# Patient Record
Sex: Male | Born: 1975 | Race: White | Hispanic: No | Marital: Single | Smoking: Current every day smoker
Health system: Southern US, Community
[De-identification: ages and names within clinical notes are randomized; demographics above are authoritative.]

## PROBLEM LIST (undated history)

## (undated) DIAGNOSIS — I10 Essential (primary) hypertension: Secondary | ICD-10-CM

## (undated) DIAGNOSIS — E785 Hyperlipidemia, unspecified: Secondary | ICD-10-CM

## (undated) DIAGNOSIS — F17200 Nicotine dependence, unspecified, uncomplicated: Secondary | ICD-10-CM

## (undated) HISTORY — DX: Hyperlipidemia, unspecified: E78.5

## (undated) HISTORY — DX: Essential (primary) hypertension: I10

## (undated) HISTORY — DX: Nicotine dependence, unspecified, uncomplicated: F17.200

---

## 1998-09-08 HISTORY — PX: INGUINAL HERNIA REPAIR: SUR1180

## 1998-09-09 ENCOUNTER — Ambulatory Visit (HOSPITAL_BASED_OUTPATIENT_CLINIC_OR_DEPARTMENT_OTHER): Admission: RE | Admit: 1998-09-09 | Discharge: 1998-09-09 | Payer: Self-pay | Admitting: *Deleted

## 1998-09-25 ENCOUNTER — Ambulatory Visit (HOSPITAL_COMMUNITY): Admission: RE | Admit: 1998-09-25 | Discharge: 1998-09-25 | Payer: Self-pay | Admitting: *Deleted

## 1998-11-11 ENCOUNTER — Inpatient Hospital Stay (HOSPITAL_COMMUNITY): Admission: RE | Admit: 1998-11-11 | Discharge: 1998-11-14 | Payer: Self-pay | Admitting: *Deleted

## 2000-03-01 ENCOUNTER — Other Ambulatory Visit (HOSPITAL_COMMUNITY): Admission: RE | Admit: 2000-03-01 | Discharge: 2000-04-18 | Payer: Self-pay | Admitting: Psychiatry

## 2000-03-09 ENCOUNTER — Encounter: Admission: RE | Admit: 2000-03-09 | Discharge: 2000-06-07 | Payer: Self-pay | Admitting: Anesthesiology

## 2000-07-21 ENCOUNTER — Inpatient Hospital Stay (HOSPITAL_COMMUNITY): Admission: EM | Admit: 2000-07-21 | Discharge: 2000-07-25 | Payer: Self-pay | Admitting: Psychiatry

## 2000-07-26 ENCOUNTER — Other Ambulatory Visit (HOSPITAL_COMMUNITY): Admission: RE | Admit: 2000-07-26 | Discharge: 2000-08-05 | Payer: Self-pay | Admitting: Psychiatry

## 2000-08-24 ENCOUNTER — Inpatient Hospital Stay (HOSPITAL_COMMUNITY): Admission: EM | Admit: 2000-08-24 | Discharge: 2000-09-01 | Payer: Self-pay | Admitting: Psychiatry

## 2000-09-02 ENCOUNTER — Other Ambulatory Visit (HOSPITAL_COMMUNITY): Admission: RE | Admit: 2000-09-02 | Discharge: 2000-09-05 | Payer: Self-pay | Admitting: Psychiatry

## 2000-09-05 ENCOUNTER — Ambulatory Visit (HOSPITAL_COMMUNITY): Admission: RE | Admit: 2000-09-05 | Discharge: 2000-09-05 | Payer: Self-pay | Admitting: Psychiatry

## 2000-09-20 ENCOUNTER — Inpatient Hospital Stay (HOSPITAL_COMMUNITY): Admission: EM | Admit: 2000-09-20 | Discharge: 2000-09-23 | Payer: Self-pay | Admitting: *Deleted

## 2000-10-06 ENCOUNTER — Inpatient Hospital Stay (HOSPITAL_COMMUNITY): Admission: EM | Admit: 2000-10-06 | Discharge: 2000-10-07 | Payer: Self-pay | Admitting: Psychiatry

## 2001-06-20 ENCOUNTER — Inpatient Hospital Stay (HOSPITAL_COMMUNITY): Admission: EM | Admit: 2001-06-20 | Discharge: 2001-06-21 | Payer: Self-pay | Admitting: Psychiatry

## 2001-06-20 ENCOUNTER — Encounter: Payer: Self-pay | Admitting: Emergency Medicine

## 2001-06-22 ENCOUNTER — Other Ambulatory Visit (HOSPITAL_COMMUNITY): Admission: RE | Admit: 2001-06-22 | Discharge: 2001-06-27 | Payer: Self-pay | Admitting: Psychiatry

## 2002-02-07 ENCOUNTER — Inpatient Hospital Stay (HOSPITAL_COMMUNITY): Admission: EM | Admit: 2002-02-07 | Discharge: 2002-02-09 | Payer: Self-pay | Admitting: Psychiatry

## 2002-04-16 ENCOUNTER — Inpatient Hospital Stay (HOSPITAL_COMMUNITY): Admission: EM | Admit: 2002-04-16 | Discharge: 2002-04-17 | Payer: Self-pay | Admitting: *Deleted

## 2002-04-16 ENCOUNTER — Encounter: Payer: Self-pay | Admitting: Internal Medicine

## 2002-04-17 ENCOUNTER — Inpatient Hospital Stay (HOSPITAL_COMMUNITY): Admission: EM | Admit: 2002-04-17 | Discharge: 2002-04-20 | Payer: Self-pay | Admitting: Psychiatry

## 2002-04-25 ENCOUNTER — Inpatient Hospital Stay (HOSPITAL_COMMUNITY): Admission: EM | Admit: 2002-04-25 | Discharge: 2002-04-27 | Payer: Self-pay | Admitting: Psychiatry

## 2018-03-07 ENCOUNTER — Ambulatory Visit: Payer: BLUE CROSS/BLUE SHIELD | Admitting: Family Medicine

## 2018-03-07 ENCOUNTER — Encounter: Payer: Self-pay | Admitting: Family Medicine

## 2018-03-07 VITALS — BP 110/82 | HR 80 | Temp 98.0°F | Wt 220.0 lb

## 2018-03-07 DIAGNOSIS — Z23 Encounter for immunization: Secondary | ICD-10-CM

## 2018-03-07 DIAGNOSIS — Z720 Tobacco use: Secondary | ICD-10-CM | POA: Diagnosis not present

## 2018-03-07 DIAGNOSIS — Z8042 Family history of malignant neoplasm of prostate: Secondary | ICD-10-CM

## 2018-03-07 DIAGNOSIS — Z0000 Encounter for general adult medical examination without abnormal findings: Secondary | ICD-10-CM | POA: Diagnosis not present

## 2018-03-07 LAB — POCT URINALYSIS DIPSTICK
BILIRUBIN UA: NEGATIVE
Glucose, UA: NEGATIVE
Ketones, UA: NEGATIVE
Leukocytes, UA: NEGATIVE
Nitrite, UA: NEGATIVE
PH UA: 6 (ref 5.0–8.0)
RBC UA: NEGATIVE
Spec Grav, UA: >=1.03 — AB (ref 1.010–1.025)
UROBILINOGEN UA: 0.2 U/dL

## 2018-03-07 MED ORDER — VARENICLINE TARTRATE 0.5 MG PO TABS: ORAL_TABLET | ORAL | 11 refills | Status: DC

## 2018-03-07 NOTE — Progress Notes (Signed)
Eduard Clos is a 42 year old single male smoker.Marland Kitchen... 1/2-3/4 a pack of cigarettes a day..... Who comes in today for general physical examination  His father was Dr. Ivin Booty one of our heart surgeons who died last year pancreatic cancer  Tries been hospitalized right inguinal hernia surgery otherwise no other hospitalizations. He has had a history of alcohol abuse. He says he now drinks 18 beers per week. He doesn't drink daily.  No major illnesses or injuries  No major allergies. Tetanus booster given today.  Family history his father died last year pancreatic cancer mother has history of hypertension thyroid nodules. Maternal grandfather prostate cancer. One sister in good health another sister has alopecia autoimmune disease.  Recommend annual eye exam since his grandmother had glaucoma. He does get regular dental care. Colonoscopy not until age 38. No family history of colon cancer polyps.  Social history......Marland Kitchen single lives in [place] with his sister. 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 He's now spending a lot of time in [place] with his mom helping her deal with his father's affairs.  He does express a willingness to stop smoking. He's never tried the Chantix program.  14 point review of systems June otherwise negative  BP 110/82 (BP Location: Left Arm, Patient Position: Sitting, Cuff Size: Large)   Pulse 80   Temp 98 F (36.7 C) (Oral)   Wt 220 lb (99.8 kg)  Well-developed well-nourished male no acute distress vital signs stable he is afebrile HEENT were negative neck was supple thyroid is not enlarged cardiopulmonary exam normal abdominal exam normal extremities normal skin normal peripheral pulses normal  #1 healthy male  #2 tobacco abuse.......... start on the Chantix program  #3 grief reaction secondary to father's death.......Marland Kitchen recommend he and his mom go to the hospice grief counseling program.  #4 family history of prostate cancer..........Marland Kitchen begin screening  PSAs now.

## 2018-03-07 NOTE — Patient Instructions (Signed)
Labs today....... I will call you if there is anything abnormal  Chantix 0.5...........Marland Kitchen 1 daily in the morning.........Marland Kitchen begin to taper you cigarettes by taking your total daily consumption and decreasing by 2 per week  Return in May 28 for follow-up  I would suggest that you and your mom both go to the hospice grief counseling program

## 2018-03-08 LAB — CBC WITH DIFFERENTIAL/PLATELET
BASOS PCT: 1.2 % (ref 0.0–3.0)
Basophils Absolute: 0.1 10*3/uL (ref 0.0–0.1)
EOS ABS: 0.1 10*3/uL (ref 0.0–0.7)
Eosinophils Relative: 1.3 % (ref 0.0–5.0)
HCT: 51.5 % (ref 39.0–52.0)
HEMOGLOBIN: 17.6 g/dL — AB (ref 13.0–17.0)
Lymphocytes Relative: 34.6 % (ref 12.0–46.0)
Lymphs Abs: 2.6 10*3/uL (ref 0.7–4.0)
MCHC: 34.2 g/dL (ref 30.0–36.0)
MCV: 97.3 fl (ref 78.0–100.0)
MONO ABS: 0.7 10*3/uL (ref 0.1–1.0)
Monocytes Relative: 9 % (ref 3.0–12.0)
NEUTROS PCT: 53.9 % (ref 43.0–77.0)
Neutro Abs: 4 10*3/uL (ref 1.4–7.7)
Platelets: 236 10*3/uL (ref 150.0–400.0)
RBC: 5.3 Mil/uL (ref 4.22–5.81)
RDW: 13.1 % (ref 11.5–15.5)
WBC: 7.5 10*3/uL (ref 4.0–10.5)

## 2018-03-08 LAB — BASIC METABOLIC PANEL
BUN: 10 mg/dL (ref 6–23)
CALCIUM: 9.9 mg/dL (ref 8.4–10.5)
CO2: 26 mEq/L (ref 19–32)
Chloride: 104 mEq/L (ref 96–112)
Creatinine, Ser: 0.82 mg/dL (ref 0.40–1.50)
GFR: 110.31 mL/min (ref >60.00)
Glucose, Bld: 87 mg/dL (ref 70–99)
Potassium: 4.2 mEq/L (ref 3.5–5.1)
SODIUM: 140 meq/L (ref 135–145)

## 2018-03-08 LAB — LDL CHOLESTEROL, DIRECT: LDL DIRECT: 191 mg/dL

## 2018-03-08 LAB — HEPATIC FUNCTION PANEL
ALT: 80 U/L — ABNORMAL HIGH (ref 0–53)
AST: 43 U/L — ABNORMAL HIGH (ref 0–37)
Albumin: 4.7 g/dL (ref 3.5–5.2)
Alkaline Phosphatase: 72 U/L (ref 39–117)
Bilirubin, Direct: 0.1 mg/dL (ref 0.0–0.3)
Total Bilirubin: 0.8 mg/dL (ref 0.2–1.2)
Total Protein: 7.4 g/dL (ref 6.0–8.3)

## 2018-03-08 LAB — LIPID PANEL
Cholesterol: 258 mg/dL — ABNORMAL HIGH (ref 0–200)
HDL: 42 mg/dL (ref >39.00)
NonHDL: 216.32
Total CHOL/HDL Ratio: 6
Triglycerides: 204 mg/dL — ABNORMAL HIGH (ref 0.0–149.0)
VLDL: 40.8 mg/dL — ABNORMAL HIGH (ref 0.0–40.0)

## 2018-03-08 LAB — PSA: PSA: 0.23 ng/mL (ref 0.10–4.00)

## 2018-03-08 LAB — TSH: TSH: 1.09 u[IU]/mL (ref 0.35–4.50)

## 2018-04-04 ENCOUNTER — Ambulatory Visit: Payer: BLUE CROSS/BLUE SHIELD | Admitting: Family Medicine

## 2021-05-18 HISTORY — PX: BACK SURGERY: SHX140

## 2022-01-01 ENCOUNTER — Other Ambulatory Visit: Payer: Self-pay

## 2022-01-01 ENCOUNTER — Ambulatory Visit (INDEPENDENT_AMBULATORY_CARE_PROVIDER_SITE_OTHER): Payer: No Typology Code available for payment source | Admitting: Internal Medicine

## 2022-01-01 ENCOUNTER — Encounter: Payer: Self-pay | Admitting: Internal Medicine

## 2022-01-01 VITALS — BP 132/94 | HR 93 | Temp 98.3°F | Resp 16 | Ht 72.0 in | Wt 250.0 lb

## 2022-01-01 DIAGNOSIS — R7989 Other specified abnormal findings of blood chemistry: Secondary | ICD-10-CM | POA: Insufficient documentation

## 2022-01-01 DIAGNOSIS — E7801 Familial hypercholesterolemia: Secondary | ICD-10-CM | POA: Diagnosis not present

## 2022-01-01 DIAGNOSIS — F331 Major depressive disorder, recurrent, moderate: Secondary | ICD-10-CM | POA: Insufficient documentation

## 2022-01-01 DIAGNOSIS — Z23 Encounter for immunization: Secondary | ICD-10-CM

## 2022-01-01 DIAGNOSIS — E6609 Other obesity due to excess calories: Secondary | ICD-10-CM | POA: Diagnosis not present

## 2022-01-01 DIAGNOSIS — Z6833 Body mass index (BMI) 33.0-33.9, adult: Secondary | ICD-10-CM

## 2022-01-01 DIAGNOSIS — M545 Low back pain, unspecified: Secondary | ICD-10-CM

## 2022-01-01 DIAGNOSIS — G8929 Other chronic pain: Secondary | ICD-10-CM | POA: Diagnosis not present

## 2022-01-01 DIAGNOSIS — Z0001 Encounter for general adult medical examination with abnormal findings: Secondary | ICD-10-CM | POA: Diagnosis not present

## 2022-01-01 DIAGNOSIS — I10 Essential (primary) hypertension: Secondary | ICD-10-CM | POA: Diagnosis not present

## 2022-01-01 DIAGNOSIS — E66811 Obesity, class 1: Secondary | ICD-10-CM | POA: Insufficient documentation

## 2022-01-01 DIAGNOSIS — R109 Unspecified abdominal pain: Secondary | ICD-10-CM | POA: Diagnosis not present

## 2022-01-01 DIAGNOSIS — R0683 Snoring: Secondary | ICD-10-CM

## 2022-01-01 DIAGNOSIS — Z8042 Family history of malignant neoplasm of prostate: Secondary | ICD-10-CM

## 2022-01-01 LAB — PSA: PSA: 0.16 ng/mL (ref 0.10–4.00)

## 2022-01-01 LAB — HEPATIC FUNCTION PANEL
ALT: 90 U/L — ABNORMAL HIGH (ref 0–53)
AST: 34 U/L (ref 0–37)
Albumin: 5 g/dL (ref 3.5–5.2)
Alkaline Phosphatase: 70 U/L (ref 39–117)
Bilirubin, Direct: 0.1 mg/dL (ref 0.0–0.3)
Total Bilirubin: 0.5 mg/dL (ref 0.2–1.2)
Total Protein: 7.5 g/dL (ref 6.0–8.3)

## 2022-01-01 LAB — URINALYSIS, ROUTINE W REFLEX MICROSCOPIC
Bilirubin Urine: NEGATIVE
Hgb urine dipstick: NEGATIVE
Ketones, ur: NEGATIVE
Leukocytes,Ua: NEGATIVE
Nitrite: NEGATIVE
Specific Gravity, Urine: >=1.03 — AB (ref 1.000–1.030)
Urine Glucose: NEGATIVE
Urobilinogen, UA: 0.2 (ref 0.0–1.0)
pH: 5.5 (ref 5.0–8.0)

## 2022-01-01 LAB — BASIC METABOLIC PANEL
BUN: 10 mg/dL (ref 6–23)
CO2: 31 mEq/L (ref 19–32)
Calcium: 10 mg/dL (ref 8.4–10.5)
Chloride: 102 mEq/L (ref 96–112)
Creatinine, Ser: 0.9 mg/dL (ref 0.40–1.50)
GFR: 104 mL/min (ref >60.00)
Glucose, Bld: 99 mg/dL (ref 70–99)
Potassium: 4 mEq/L (ref 3.5–5.1)
Sodium: 139 mEq/L (ref 135–145)

## 2022-01-01 LAB — LIPID PANEL
Cholesterol: 283 mg/dL — ABNORMAL HIGH (ref 0–200)
HDL: 41.3 mg/dL (ref >39.00)
NonHDL: 241.29
Total CHOL/HDL Ratio: 7
Triglycerides: 254 mg/dL — ABNORMAL HIGH (ref 0.0–149.0)
VLDL: 50.8 mg/dL — ABNORMAL HIGH (ref 0.0–40.0)

## 2022-01-01 LAB — CBC WITH DIFFERENTIAL/PLATELET
Basophils Absolute: 0.1 10*3/uL (ref 0.0–0.1)
Basophils Relative: 0.6 % (ref 0.0–3.0)
Eosinophils Absolute: 0.1 10*3/uL (ref 0.0–0.7)
Eosinophils Relative: 1 % (ref 0.0–5.0)
HCT: 53.8 % — ABNORMAL HIGH (ref 39.0–52.0)
Hemoglobin: 17.9 g/dL — ABNORMAL HIGH (ref 13.0–17.0)
Lymphocytes Relative: 32.7 % (ref 12.0–46.0)
Lymphs Abs: 3 10*3/uL (ref 0.7–4.0)
MCHC: 33.2 g/dL (ref 30.0–36.0)
MCV: 95.5 fl (ref 78.0–100.0)
Monocytes Absolute: 0.7 10*3/uL (ref 0.1–1.0)
Monocytes Relative: 7.3 % (ref 3.0–12.0)
Neutro Abs: 5.4 10*3/uL (ref 1.4–7.7)
Neutrophils Relative %: 58.4 % (ref 43.0–77.0)
Platelets: 228 10*3/uL (ref 150.0–400.0)
RBC: 5.64 Mil/uL (ref 4.22–5.81)
RDW: 14.8 % (ref 11.5–15.5)
WBC: 9.1 10*3/uL (ref 4.0–10.5)

## 2022-01-01 LAB — TSH: TSH: 2.32 u[IU]/mL (ref 0.35–5.50)

## 2022-01-01 LAB — LDL CHOLESTEROL, DIRECT: Direct LDL: 206 mg/dL

## 2022-01-01 MED ORDER — QUETIAPINE FUMARATE 25 MG PO TABS: 25.0000 mg | ORAL_TABLET | Freq: Every day | ORAL | 0 refills | Status: DC

## 2022-01-01 MED ORDER — ROSUVASTATIN CALCIUM 20 MG PO TABS
20.0000 mg | ORAL_TABLET | Freq: Every day | ORAL | 1 refills | Status: DC
Start: 2022-01-01 — End: 2022-08-21

## 2022-01-01 MED ORDER — DULOXETINE HCL 30 MG PO CPEP: 30.0000 mg | ORAL_CAPSULE | Freq: Every day | ORAL | 0 refills | Status: DC

## 2022-01-01 NOTE — Patient Instructions (Signed)

## 2022-01-01 NOTE — Progress Notes (Signed)
Subjective:  Patient ID: Anthony Nielsen, male    DOB: August 03, 1976  Age: 46 y.o. MRN: [id number]  CC: Annual Exam and Hypertension  This visit occurred during the SARS-CoV-2 public health emergency.  Safety protocols were in place, including screening questions prior to the visit, additional usage of staff PPE, and extensive cleaning of exam room while observing appropriate contact time as indicated for disinfecting solutions.    HPI Anthony Nielsen presents for a CPX and to establish.  He completed a long-term rehab about a year and a half ago.  He tells me he has been diagnosed with PTSD, ADHD, alcohol use disorder, and possibly bipolar.  He has abstained from alcohol for at least 2 weeks and is attending [place] meetings and has a sponsor. He tried Zyprexa but gained a lot of weight.  He stopped taking Lexapro about a month ago because it was not helping him.  He has racing thoughts that cause insomnia.  He complains of fatigue.  He has had some feelings of hopelessness and helplessness but denies SI or HI.  He complains of chronic fatigue and weight gain.  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  He also mentions a 57-month history of intermittent left flank pain that he describes as a sharp sensation that comes and goes.  He has had dyspnea on exertion for 10 years.  He denies chest pain, diaphoresis, dizziness, lightheadedness, or palpitations.  Outpatient Medications Prior to Visit  Medication Sig Dispense Refill   finasteride (PROPECIA) 1 MG tablet Take 1 mg by mouth daily.     minoxidil (LONITEN) 10 MG tablet Take 10 mg by mouth daily.     varenicline (CHANTIX) 0.5 MG tablet 1 tab every morning 30 tablet 11   No facility-administered medications prior to visit.    ROS Review of Systems  Constitutional:  Positive for fatigue and unexpected weight change. Negative for appetite change, chills and diaphoresis.  HENT: Negative.    Eyes: Negative.   Respiratory:   Positive for shortness of breath. Negative for cough, chest tightness and wheezing.        ++snoring  Cardiovascular:  Negative for chest pain, palpitations and leg swelling.  Gastrointestinal:  Positive for abdominal pain. Negative for blood in stool, constipation, diarrhea and vomiting.  Endocrine: Negative.   Genitourinary: Negative.  Negative for difficulty urinating, dysuria, hematuria, penile swelling, scrotal swelling and testicular pain.  Musculoskeletal:  Positive for arthralgias and back pain. Negative for myalgias.  Skin: Negative.   Neurological:  Negative for dizziness, weakness, light-headedness and headaches.  Hematological:  Negative for adenopathy. Does not bruise/bleed easily.  Psychiatric/Behavioral: Negative.     Objective:  BP (!) 132/94 (BP Location: Left Arm, Patient Position: Sitting, Cuff Size: Large) Comment: thigh cuff (R) 132/94 (L) 132/98   Pulse 93    Temp 98.3 F (36.8 C) (Oral)    Resp 16    Ht 6' (1.829 m)    Wt 250 lb (113.4 kg)    SpO2 95%    BMI 33.91 kg/m   BP Readings from Last 3 Encounters:  01/01/22 (!) 132/94  03/07/18 110/82    Wt Readings from Last 3 Encounters:  01/01/22 250 lb (113.4 kg)  03/07/18 220 lb (99.8 kg)    Physical Exam Vitals reviewed.  Constitutional:      Appearance: He is not ill-appearing.  HENT:     Nose: Nose normal.     Mouth/Throat:  Mouth: Mucous membranes are moist.  Eyes:     General: No scleral icterus.    Conjunctiva/sclera: Conjunctivae normal.  Cardiovascular:     Rate and Rhythm: Normal rate and regular rhythm.     Heart sounds: Normal heart sounds, S1 normal and S2 normal. No murmur heard.   No friction rub. No gallop.     Comments: EKG- NSR, 82 bpm No LVH Normal EKG Pulmonary:     Effort: Pulmonary effort is normal.     Breath sounds: No stridor. No wheezing, rhonchi or rales.  Abdominal:     General: Abdomen is protuberant. There is no abdominal bruit.     Palpations: There is no mass.      Tenderness: There is abdominal tenderness (left flank is tender) in the left upper quadrant. There is no right CVA tenderness, left CVA tenderness or guarding.  Musculoskeletal:     Right lower leg: No edema.     Left lower leg: No edema.  Skin:    General: Skin is warm and dry.  Neurological:     General: No focal deficit present.     Mental Status: He is alert.  Psychiatric:        Attention and Perception: Attention and perception normal.        Mood and Affect: Mood is anxious and depressed. Affect is flat.        Speech: Speech normal. Speech is not rapid and pressured, delayed, slurred or tangential.        Behavior: Behavior normal. Behavior is not agitated, slowed, aggressive or withdrawn. Behavior is cooperative.        Thought Content: Thought content normal. Thought content is not paranoid or delusional. Thought content does not include homicidal or suicidal ideation.        Cognition and Memory: Cognition normal.        Judgment: Judgment is not impulsive.    Lab Results  Component Value Date   WBC 9.1 01/01/2022   HGB 17.9 Repeated and verified X2. (H) 01/01/2022   HCT 53.8 (H) 01/01/2022   PLT 228.0 01/01/2022   GLUCOSE 99 01/01/2022   CHOL 283 (H) 01/01/2022   TRIG 254.0 (H) 01/01/2022   HDL 41.30 01/01/2022   LDLDIRECT 206.0 01/01/2022   ALT 90 (H) 01/01/2022   AST 34 01/01/2022   NA 139 01/01/2022   K 4.0 01/01/2022   CL 102 01/01/2022   CREATININE 0.90 01/01/2022   BUN 10 01/01/2022   CO2 31 01/01/2022   TSH 2.32 01/01/2022   PSA 0.16 01/01/2022    No results found.  Assessment & Plan:   Anthony Nielsen was seen today for annual exam and hypertension.  Diagnoses and all orders for this visit:  Hypertension, unspecified type- He has stage I hypertension.  He prefers not to take an antihypertensive.  He will improve his lifestyle modifications. -     EKG 12-Lead -     Basic metabolic panel; Future -     Aldosterone + renin activity w/ ratio; Future -      CBC with Differential/Platelet; Future -     TSH; Future -     Urinalysis, Routine w reflex microscopic; Future -     Urinalysis, Routine w reflex microscopic -     TSH -     CBC with Differential/Platelet -     Aldosterone + renin activity w/ ratio -     Basic metabolic panel  Chronic left flank pain- Labs are remarkable  for elevated liver enzymes.  I have asked him to undergo a CT with contrast to screen for pathology. -     Urinalysis, Routine w reflex microscopic; Future -     Urinalysis, Routine w reflex microscopic -     CT Abdomen Pelvis W Contrast; Future  Encounter for general adult medical examination with abnormal findings- Exam completed, labs reviewed, vaccines reviewed and updated, cancer screenings addressed, patient education was given. -     Lipid panel; Future -     PSA; Future -     HIV Antibody (routine testing w rflx); Future -     HIV Antibody (routine testing w rflx) -     PSA -     Lipid panel  Family history of prostate cancer -     PSA; Future -     PSA  Class 1 obesity due to excess calories with serious comorbidity and body mass index (BMI) of 33.0 to 33.9 in adult  Moderate episode of recurrent major depressive disorder (HCC) -     DULoxetine (CYMBALTA) 30 MG capsule; Take 1 capsule (30 mg total) by mouth daily. -     QUEtiapine (SEROQUEL) 25 MG tablet; Take 1 tablet (25 mg total) by mouth at bedtime. -     TSH; Future -     TSH  Chronic bilateral low back pain without sciatica -     DULoxetine (CYMBALTA) 30 MG capsule; Take 1 capsule (30 mg total) by mouth daily.  Elevated LFTs- Screening for viral hepatitis is negative.  I have ordered a CT scan with contrast to screen for malignancy.  I have asked him to get vaccinated against hepatitis A and B. -     Hepatic function panel; Future -     Hepatitis B surface antibody,quantitative; Future -     Hepatitis B surface antigen; Future -     Hepatitis A antibody, total; Future -     Hepatitis B core  antibody, total; Future -     Hepatitis C antibody; Future -     Hepatitis C antibody -     Hepatitis B core antibody, total -     Hepatitis A antibody, total -     Hepatitis B surface antigen -     Hepatitis B surface antibody,quantitative -     Hepatic function panel -     CT Abdomen Pelvis W Contrast; Future  Essential familial hypercholesterolemia -     rosuvastatin (CRESTOR) 20 MG tablet; Take 1 tablet (20 mg total) by mouth daily.  Snoring -     Ambulatory referral to Sleep Studies  Other orders -     Flu Vaccine QUAD 6+ mos PF IM (Fluarix Quad PF) -     LDL cholesterol, direct   I have discontinued Denton Meek. [place]'s finasteride, minoxidil, and varenicline. I am also having him start on DULoxetine, QUEtiapine, and rosuvastatin.  Meds ordered this encounter  Medications   DULoxetine (CYMBALTA) 30 MG capsule    Sig: Take 1 capsule (30 mg total) by mouth daily.    Dispense:  30 capsule    Refill:  0   QUEtiapine (SEROQUEL) 25 MG tablet    Sig: Take 1 tablet (25 mg total) by mouth at bedtime.    Dispense:  30 tablet    Refill:  0   rosuvastatin (CRESTOR) 20 MG tablet    Sig: Take 1 tablet (20 mg total) by mouth daily.    Dispense:  90 tablet  Refill:  1     Follow-up: Return in about 3 months (around 03/31/2022).  [name], MD

## 2022-01-06 ENCOUNTER — Encounter: Payer: Self-pay | Admitting: Internal Medicine

## 2022-01-06 LAB — ALDOSTERONE + RENIN ACTIVITY W/ RATIO
ALDO / PRA Ratio: 7 Ratio (ref 0.9–28.9)
Aldosterone: 16 ng/dL
Renin Activity: 2.28 ng/mL/h (ref 0.25–5.82)

## 2022-01-06 LAB — HEPATITIS C ANTIBODY
Hepatitis C Ab: NONREACTIVE
SIGNAL TO CUT-OFF: <0.02 (ref <1.00)

## 2022-01-06 LAB — HEPATITIS A ANTIBODY, TOTAL: Hepatitis A AB,Total: NONREACTIVE

## 2022-01-06 LAB — HEPATITIS B SURFACE ANTIBODY, QUANTITATIVE: Hep B S AB Quant (Post): <5 m[IU]/mL — ABNORMAL LOW (ref >=10)

## 2022-01-06 LAB — HIV ANTIBODY (ROUTINE TESTING W REFLEX): HIV 1&2 Ab, 4th Generation: NONREACTIVE

## 2022-01-06 LAB — HEPATITIS B CORE ANTIBODY, TOTAL: Hep B Core Total Ab: NONREACTIVE

## 2022-01-06 LAB — HEPATITIS B SURFACE ANTIGEN: Hepatitis B Surface Ag: NONREACTIVE

## 2022-01-08 ENCOUNTER — Other Ambulatory Visit: Payer: Self-pay | Admitting: Neurological Surgery

## 2022-01-12 ENCOUNTER — Encounter (HOSPITAL_COMMUNITY): Payer: Self-pay | Admitting: Neurological Surgery

## 2022-01-12 ENCOUNTER — Other Ambulatory Visit: Payer: Self-pay

## 2022-01-12 NOTE — Progress Notes (Signed)
PCP - [name], MD ?Cardiologist - denies ?EKG - 01/01/22 ?Chest x-ray -  ?ECHO -  ?Cardiac Cath -  ?CPAP -  ? ?ERAS Protcol - clears 0900 ?COVID TEST- DOS ? ?Anesthesia review: n/a ? ?------------- ? ?SDW INSTRUCTIONS: ? ?Your procedure is scheduled on wed 3/8. Please report to [facility] Main Entrance "A" at 0930 A.M., and check in at the Admitting office. Call this number if you have problems the morning of surgery: [phone] ? ? ?Remember: Do not eat after midnight the night before your surgery ? ?You may drink clear liquids until 0900 AM the morning of your surgery.   ?Clear liquids allowed are: Water, Non-Citrus Juices (without pulp), Carbonated Beverages, Clear Tea, Black Coffee Only, and Gatorade ?  ?Medications to take morning of surgery with a sip of water include: ?DULoxetine (CYMBALTA) ? ?As of today, STOP taking any Aspirin (unless otherwise instructed by your surgeon), Aleve, Naproxen, Ibuprofen, Motrin, Advil, Goody's, BC's, all herbal medications, fish oil, and all vitamins. ? ?  ?The Morning of Surgery ?Do not wear jewelry ?Do not wear lotions, powders, colognes, or deodorant ?Do not bring valuables to the hospital. ?[facility] is not responsible for any belongings or valuables. ? ?If you are a smoker, DO NOT Smoke 24 hours prior to surgery ? ?If you wear a CPAP at night please bring your mask the morning of surgery  ? ?Remember that you must have someone to transport you home after your surgery, and remain with you for 24 hours if you are discharged the same day. ? ?Please bring cases for contacts, glasses, hearing aids, dentures or bridgework because it cannot be worn into surgery.  ? ?Patients discharged the day of surgery will not be allowed to drive home.  ? ?Please shower the NIGHT BEFORE/MORNING OF SURGERY (use antibacterial soap like DIAL soap if possible). Wear comfortable clothes the morning of surgery. Oral Hygiene is also important to reduce your risk of infection.  Remember -  BRUSH YOUR TEETH THE MORNING OF SURGERY WITH YOUR REGULAR TOOTHPASTE ? ?Patient denies shortness of breath, fever, cough and chest pain.  ? ? ?   ? ?

## 2022-01-13 ENCOUNTER — Ambulatory Visit (HOSPITAL_BASED_OUTPATIENT_CLINIC_OR_DEPARTMENT_OTHER): Payer: No Typology Code available for payment source | Admitting: Certified Registered Nurse Anesthetist

## 2022-01-13 ENCOUNTER — Ambulatory Visit (HOSPITAL_COMMUNITY): Payer: No Typology Code available for payment source

## 2022-01-13 ENCOUNTER — Other Ambulatory Visit: Payer: Self-pay

## 2022-01-13 ENCOUNTER — Ambulatory Visit (HOSPITAL_COMMUNITY)
Admission: RE | Disposition: A | Discharge status: Home / Self Care | Payer: Self-pay | Source: Home / Self Care | Attending: Neurological Surgery

## 2022-01-13 ENCOUNTER — Encounter (HOSPITAL_COMMUNITY): Payer: Self-pay | Admitting: Neurological Surgery

## 2022-01-13 ENCOUNTER — Observation Stay (HOSPITAL_COMMUNITY)
Admission: RE | Admit: 2022-01-13 | Discharge: 2022-01-13 | Disposition: A | Discharge status: Home / Self Care | Payer: No Typology Code available for payment source | Attending: Neurological Surgery | Admitting: Neurological Surgery

## 2022-01-13 ENCOUNTER — Ambulatory Visit (HOSPITAL_COMMUNITY): Payer: No Typology Code available for payment source | Admitting: Certified Registered Nurse Anesthetist

## 2022-01-13 DIAGNOSIS — F17210 Nicotine dependence, cigarettes, uncomplicated: Secondary | ICD-10-CM | POA: Insufficient documentation

## 2022-01-13 DIAGNOSIS — Z419 Encounter for procedure for purposes other than remedying health state, unspecified: Secondary | ICD-10-CM

## 2022-01-13 DIAGNOSIS — M5116 Intervertebral disc disorders with radiculopathy, lumbar region: Secondary | ICD-10-CM | POA: Diagnosis present

## 2022-01-13 DIAGNOSIS — Z20822 Contact with and (suspected) exposure to covid-19: Secondary | ICD-10-CM | POA: Diagnosis not present

## 2022-01-13 DIAGNOSIS — Z9889 Other specified postprocedural states: Secondary | ICD-10-CM

## 2022-01-13 HISTORY — PX: LUMBAR LAMINECTOMY/DECOMPRESSION MICRODISCECTOMY: SHX5026

## 2022-01-13 LAB — GLUCOSE, CAPILLARY: Glucose-Capillary: 200 mg/dL — ABNORMAL HIGH (ref 70–99)

## 2022-01-13 LAB — PROTIME-INR
INR: 1 (ref 0.8–1.2)
Prothrombin Time: 12.7 seconds (ref 11.4–15.2)

## 2022-01-13 LAB — SARS CORONAVIRUS 2 BY RT PCR (HOSPITAL ORDER, PERFORMED IN ~~LOC~~ HOSPITAL LAB): SARS Coronavirus 2: NEGATIVE

## 2022-01-13 SURGERY — LUMBAR LAMINECTOMY/DECOMPRESSION MICRODISCECTOMY 1 LEVEL
Anesthesia: General | Site: Spine Lumbar | Laterality: Right

## 2022-01-13 MED ORDER — CEFAZOLIN SODIUM-DEXTROSE 2-4 GM/100ML-% IV SOLN
2.0000 g | INTRAVENOUS | Status: AC
  Administered 2022-01-13: 2 g via INTRAVENOUS
  Filled 2022-01-13: qty 100

## 2022-01-13 MED ORDER — BUPIVACAINE HCL (PF) 0.25 % IJ SOLN
INTRAMUSCULAR | Status: AC
  Filled 2022-01-13: qty 30

## 2022-01-13 MED ORDER — DEXAMETHASONE 4 MG PO TABS: 4.0000 mg | ORAL_TABLET | Freq: Four times a day (QID) | ORAL | Status: DC

## 2022-01-13 MED ORDER — PHENYLEPHRINE 40 MCG/ML (10ML) SYRINGE FOR IV PUSH (FOR BLOOD PRESSURE SUPPORT)
PREFILLED_SYRINGE | INTRAVENOUS | Status: DC | PRN
  Administered 2022-01-13: 80 ug via INTRAVENOUS

## 2022-01-13 MED ORDER — THROMBIN 20000 UNITS EX KIT
PACK | CUTANEOUS | Status: DC | PRN
  Administered 2022-01-13: 10000 [IU] via TOPICAL

## 2022-01-13 MED ORDER — EPHEDRINE 5 MG/ML INJ
INTRAVENOUS | Status: AC
  Filled 2022-01-13: qty 5

## 2022-01-13 MED ORDER — LIDOCAINE 2% (20 MG/ML) 5 ML SYRINGE
INTRAMUSCULAR | Status: DC | PRN
  Administered 2022-01-13: 60 mg via INTRAVENOUS

## 2022-01-13 MED ORDER — CEFAZOLIN SODIUM-DEXTROSE 2-4 GM/100ML-% IV SOLN: 2.0000 g | Freq: Three times a day (TID) | INTRAVENOUS | Status: DC

## 2022-01-13 MED ORDER — HYDROCODONE-ACETAMINOPHEN 5-325 MG PO TABS: 1.0000 | ORAL_TABLET | ORAL | 0 refills | Status: DC | PRN

## 2022-01-13 MED ORDER — MIDAZOLAM HCL 2 MG/2ML IJ SOLN
INTRAMUSCULAR | Status: AC
  Filled 2022-01-13: qty 2

## 2022-01-13 MED ORDER — SODIUM CHLORIDE 0.9% FLUSH: 3.0000 mL | Freq: Two times a day (BID) | INTRAVENOUS | Status: DC

## 2022-01-13 MED ORDER — PROPOFOL 10 MG/ML IV BOLUS
INTRAVENOUS | Status: AC
  Filled 2022-01-13: qty 20

## 2022-01-13 MED ORDER — FENTANYL CITRATE (PF) 250 MCG/5ML IJ SOLN
INTRAMUSCULAR | Status: AC
  Filled 2022-01-13: qty 5

## 2022-01-13 MED ORDER — SODIUM CHLORIDE 0.9% FLUSH: 3.0000 mL | INTRAVENOUS | Status: DC | PRN

## 2022-01-13 MED ORDER — THROMBIN 5000 UNITS EX SOLR
CUTANEOUS | Status: AC
  Filled 2022-01-13: qty 10000

## 2022-01-13 MED ORDER — ONDANSETRON HCL 4 MG/2ML IJ SOLN: 4.0000 mg | Freq: Four times a day (QID) | INTRAMUSCULAR | Status: DC | PRN

## 2022-01-13 MED ORDER — ONDANSETRON HCL 4 MG/2ML IJ SOLN
INTRAMUSCULAR | Status: DC | PRN
  Administered 2022-01-13: 4 mg via INTRAVENOUS

## 2022-01-13 MED ORDER — SODIUM CHLORIDE 0.9 % IV SOLN: 250.0000 mL | INTRAVENOUS | Status: DC

## 2022-01-13 MED ORDER — EPHEDRINE SULFATE-NACL 50-0.9 MG/10ML-% IV SOSY
PREFILLED_SYRINGE | INTRAVENOUS | Status: DC | PRN
  Administered 2022-01-13: 10 mg via INTRAVENOUS
  Administered 2022-01-13: 5 mg via INTRAVENOUS

## 2022-01-13 MED ORDER — HYDROMORPHONE HCL 1 MG/ML IJ SOLN
0.2500 mg | INTRAMUSCULAR | Status: DC | PRN
  Administered 2022-01-13 (×2): 0.5 mg via INTRAVENOUS

## 2022-01-13 MED ORDER — LACTATED RINGERS IV SOLN: INTRAVENOUS | Status: DC

## 2022-01-13 MED ORDER — BUPIVACAINE HCL (PF) 0.25 % IJ SOLN
INTRAMUSCULAR | Status: DC | PRN
  Administered 2022-01-13: 16 mL

## 2022-01-13 MED ORDER — DULOXETINE HCL 30 MG PO CPEP: 30.0000 mg | ORAL_CAPSULE | Freq: Every day | ORAL | Status: DC

## 2022-01-13 MED ORDER — MORPHINE SULFATE (PF) 2 MG/ML IV SOLN: 2.0000 mg | INTRAVENOUS | Status: DC | PRN

## 2022-01-13 MED ORDER — PHENYLEPHRINE 40 MCG/ML (10ML) SYRINGE FOR IV PUSH (FOR BLOOD PRESSURE SUPPORT)
PREFILLED_SYRINGE | INTRAVENOUS | Status: AC
  Filled 2022-01-13: qty 10

## 2022-01-13 MED ORDER — CELECOXIB 200 MG PO CAPS: 200.0000 mg | ORAL_CAPSULE | Freq: Two times a day (BID) | ORAL | Status: DC

## 2022-01-13 MED ORDER — CHLORHEXIDINE GLUCONATE 0.12 % MT SOLN: 15.0000 mL | OROMUCOSAL | Status: AC

## 2022-01-13 MED ORDER — HYDROMORPHONE HCL 1 MG/ML IJ SOLN
INTRAMUSCULAR | Status: AC
  Filled 2022-01-13: qty 1

## 2022-01-13 MED ORDER — SUGAMMADEX SODIUM 200 MG/2ML IV SOLN
INTRAVENOUS | Status: DC | PRN
Start: 2022-01-13 — End: 2022-01-13
  Administered 2022-01-13: 200 mg via INTRAVENOUS

## 2022-01-13 MED ORDER — THROMBIN 5000 UNITS EX SOLR: OROMUCOSAL | Status: DC | PRN

## 2022-01-13 MED ORDER — MENTHOL 3 MG MT LOZG: 1.0000 | LOZENGE | OROMUCOSAL | Status: DC | PRN

## 2022-01-13 MED ORDER — POTASSIUM CHLORIDE IN NACL 20-0.9 MEQ/L-% IV SOLN: INTRAVENOUS | Status: DC

## 2022-01-13 MED ORDER — LIDOCAINE 2% (20 MG/ML) 5 ML SYRINGE
INTRAMUSCULAR | Status: AC
  Filled 2022-01-13: qty 5

## 2022-01-13 MED ORDER — ONDANSETRON HCL 4 MG/2ML IJ SOLN
INTRAMUSCULAR | Status: AC
  Filled 2022-01-13: qty 2

## 2022-01-13 MED ORDER — QUETIAPINE FUMARATE 25 MG PO TABS
25.0000 mg | ORAL_TABLET | Freq: Every day | ORAL | Status: DC
  Filled 2022-01-13: qty 1

## 2022-01-13 MED ORDER — GABAPENTIN 300 MG PO CAPS
300.0000 mg | ORAL_CAPSULE | ORAL | Status: AC
  Administered 2022-01-13: 300 mg via ORAL
  Filled 2022-01-13: qty 1

## 2022-01-13 MED ORDER — ROCURONIUM BROMIDE 10 MG/ML (PF) SYRINGE
PREFILLED_SYRINGE | INTRAVENOUS | Status: DC | PRN
Start: 2022-01-13 — End: 2022-01-13
  Administered 2022-01-13: 20 mg via INTRAVENOUS
  Administered 2022-01-13: 80 mg via INTRAVENOUS

## 2022-01-13 MED ORDER — 0.9 % SODIUM CHLORIDE (POUR BTL) OPTIME
TOPICAL | Status: DC | PRN
  Administered 2022-01-13: 1000 mL

## 2022-01-13 MED ORDER — METHOCARBAMOL 500 MG PO TABS
500.0000 mg | ORAL_TABLET | Freq: Four times a day (QID) | ORAL | Status: DC | PRN
Start: 2022-01-13 — End: 2022-01-13
  Administered 2022-01-13: 500 mg via ORAL
  Filled 2022-01-13: qty 1

## 2022-01-13 MED ORDER — DEXAMETHASONE SODIUM PHOSPHATE 4 MG/ML IJ SOLN: 4.0000 mg | Freq: Four times a day (QID) | INTRAMUSCULAR | Status: DC

## 2022-01-13 MED ORDER — ROCURONIUM BROMIDE 10 MG/ML (PF) SYRINGE
PREFILLED_SYRINGE | INTRAVENOUS | Status: AC
  Filled 2022-01-13: qty 10

## 2022-01-13 MED ORDER — HYDROCODONE-ACETAMINOPHEN 7.5-325 MG PO TABS
1.0000 | ORAL_TABLET | Freq: Four times a day (QID) | ORAL | Status: DC
  Administered 2022-01-13: 1 via ORAL
  Filled 2022-01-13: qty 1

## 2022-01-13 MED ORDER — ACETAMINOPHEN 325 MG PO TABS: 650.0000 mg | ORAL_TABLET | ORAL | Status: DC | PRN

## 2022-01-13 MED ORDER — DEXAMETHASONE SODIUM PHOSPHATE 10 MG/ML IJ SOLN
INTRAMUSCULAR | Status: AC
  Filled 2022-01-13: qty 1

## 2022-01-13 MED ORDER — CHLORHEXIDINE GLUCONATE 0.12 % MT SOLN
OROMUCOSAL | Status: AC
  Administered 2022-01-13: 15 mL via OROMUCOSAL
  Filled 2022-01-13: qty 15

## 2022-01-13 MED ORDER — FENTANYL CITRATE (PF) 100 MCG/2ML IJ SOLN
INTRAMUSCULAR | Status: DC | PRN
  Administered 2022-01-13: 100 ug via INTRAVENOUS
  Administered 2022-01-13 (×3): 50 ug via INTRAVENOUS

## 2022-01-13 MED ORDER — ONDANSETRON HCL 4 MG PO TABS: 4.0000 mg | ORAL_TABLET | Freq: Four times a day (QID) | ORAL | Status: DC | PRN

## 2022-01-13 MED ORDER — CHLORHEXIDINE GLUCONATE CLOTH 2 % EX PADS: 6.0000 | MEDICATED_PAD | Freq: Once | CUTANEOUS | Status: DC

## 2022-01-13 MED ORDER — MIDAZOLAM HCL 2 MG/2ML IJ SOLN
INTRAMUSCULAR | Status: DC | PRN
  Administered 2022-01-13: 2 mg via INTRAVENOUS

## 2022-01-13 MED ORDER — ACETAMINOPHEN 650 MG RE SUPP: 650.0000 mg | RECTAL | Status: DC | PRN

## 2022-01-13 MED ORDER — SENNA 8.6 MG PO TABS: 1.0000 | ORAL_TABLET | Freq: Two times a day (BID) | ORAL | Status: DC

## 2022-01-13 MED ORDER — PHENOL 1.4 % MT LIQD: 1.0000 | OROMUCOSAL | Status: DC | PRN

## 2022-01-13 MED ORDER — PROPOFOL 10 MG/ML IV BOLUS
INTRAVENOUS | Status: DC | PRN
  Administered 2022-01-13: 200 mg via INTRAVENOUS

## 2022-01-13 MED ORDER — METHOCARBAMOL 1000 MG/10ML IJ SOLN
500.0000 mg | Freq: Four times a day (QID) | INTRAVENOUS | Status: DC | PRN
  Filled 2022-01-13: qty 5

## 2022-01-13 MED ORDER — ACETAMINOPHEN 500 MG PO TABS
1000.0000 mg | ORAL_TABLET | ORAL | Status: AC
  Administered 2022-01-13: 1000 mg via ORAL
  Filled 2022-01-13: qty 2

## 2022-01-13 MED ORDER — DEXAMETHASONE SODIUM PHOSPHATE 10 MG/ML IJ SOLN
INTRAMUSCULAR | Status: DC | PRN
  Administered 2022-01-13: 5 mg via INTRAVENOUS

## 2022-01-13 MED ORDER — THROMBIN 5000 UNITS EX SOLR
CUTANEOUS | Status: AC
  Filled 2022-01-13: qty 5000

## 2022-01-13 SURGICAL SUPPLY — 48 items
ADH SKN CLS APL DERMABOND .7 (GAUZE/BANDAGES/DRESSINGS) ×1
APL SKNCLS STERI-STRIP NONHPOA (GAUZE/BANDAGES/DRESSINGS) ×1
BAG COUNTER SPONGE SURGICOUNT (BAG) ×3 IMPLANT
BAG SPNG CNTER NS LX DISP (BAG) ×1
BAND INSRT 18 STRL LF DISP RB (MISCELLANEOUS) ×2
BAND RUBBER #18 3X1/16 STRL (MISCELLANEOUS) ×6 IMPLANT
BENZOIN TINCTURE PRP APPL 2/3 (GAUZE/BANDAGES/DRESSINGS) ×3 IMPLANT
BUR CARBIDE MATCH 3.0 (BURR) ×3 IMPLANT
CANISTER SUCT 3000ML PPV (MISCELLANEOUS) ×3 IMPLANT
CLSR STERI-STRIP ANTIMIC 1/2X4 (GAUZE/BANDAGES/DRESSINGS) ×1 IMPLANT
DERMABOND ADVANCED (GAUZE/BANDAGES/DRESSINGS) ×1
DERMABOND ADVANCED .7 DNX12 (GAUZE/BANDAGES/DRESSINGS) IMPLANT
DRAPE LAPAROTOMY 100X72X124 (DRAPES) ×3 IMPLANT
DRAPE MICROSCOPE LEICA (MISCELLANEOUS) ×3 IMPLANT
DRAPE SURG 17X23 STRL (DRAPES) ×3 IMPLANT
DRSG OPSITE POSTOP 3X4 (GAUZE/BANDAGES/DRESSINGS) ×1 IMPLANT
DURAPREP 26ML APPLICATOR (WOUND CARE) ×3 IMPLANT
ELECT REM PT RETURN 9FT ADLT (ELECTROSURGICAL) ×2
ELECTRODE REM PT RTRN 9FT ADLT (ELECTROSURGICAL) ×2 IMPLANT
GAUZE 4X4 16PLY ~~LOC~~+RFID DBL (SPONGE) ×1 IMPLANT
GLOVE SURG ENC MOIS LTX SZ7 (GLOVE) ×1 IMPLANT
GLOVE SURG ENC MOIS LTX SZ8 (GLOVE) ×3 IMPLANT
GLOVE SURG UNDER POLY LF SZ6.5 (GLOVE) ×3 IMPLANT
GLOVE SURG UNDER POLY LF SZ7 (GLOVE) ×3 IMPLANT
GLOVE SURG UNDER POLY LF SZ7.5 (GLOVE) ×1 IMPLANT
GOWN STRL REUS W/ TWL LRG LVL3 (GOWN DISPOSABLE) IMPLANT
GOWN STRL REUS W/ TWL XL LVL3 (GOWN DISPOSABLE) ×2 IMPLANT
GOWN STRL REUS W/TWL 2XL LVL3 (GOWN DISPOSABLE) IMPLANT
GOWN STRL REUS W/TWL LRG LVL3 (GOWN DISPOSABLE) ×4
GOWN STRL REUS W/TWL XL LVL3 (GOWN DISPOSABLE) ×2
HEMOSTAT POWDER KIT SURGIFOAM (HEMOSTASIS) ×3 IMPLANT
KIT BASIN OR (CUSTOM PROCEDURE TRAY) ×3 IMPLANT
KIT TURNOVER KIT B (KITS) ×3 IMPLANT
NDL HYPO 25X1 1.5 SAFETY (NEEDLE) ×2 IMPLANT
NDL SPNL 20GX3.5 QUINCKE YW (NEEDLE) IMPLANT
NEEDLE HYPO 25X1 1.5 SAFETY (NEEDLE) ×2 IMPLANT
NEEDLE SPNL 20GX3.5 QUINCKE YW (NEEDLE) IMPLANT
NS IRRIG 1000ML POUR BTL (IV SOLUTION) ×3 IMPLANT
PACK LAMINECTOMY NEURO (CUSTOM PROCEDURE TRAY) ×3 IMPLANT
PAD ARMBOARD 7.5X6 YLW CONV (MISCELLANEOUS) ×12 IMPLANT
STRIP CLOSURE SKIN 1/2X4 (GAUZE/BANDAGES/DRESSINGS) ×3 IMPLANT
SUT VIC AB 0 CT1 18XCR BRD8 (SUTURE) ×2 IMPLANT
SUT VIC AB 0 CT1 8-18 (SUTURE) ×2
SUT VIC AB 2-0 CP2 18 (SUTURE) ×3 IMPLANT
SUT VIC AB 3-0 SH 8-18 (SUTURE) ×4 IMPLANT
TOWEL GREEN STERILE (TOWEL DISPOSABLE) ×3 IMPLANT
TOWEL GREEN STERILE FF (TOWEL DISPOSABLE) ×3 IMPLANT
WATER STERILE IRR 1000ML POUR (IV SOLUTION) ×3 IMPLANT

## 2022-01-13 NOTE — Plan of Care (Signed)
Pt doing well. Pt and mother given D/C instructions with verbal understanding. Rx's were sent to the pharmacy by MD. Pt's incision is clean and dry with no sign of infection. Pt's IV was removed prior to D/C. Pt D/C'd home via wheelchair per MD order. Pt is stable @ D/C and has no other needs at this time. Holli Humbles, RN ?

## 2022-01-13 NOTE — H&P (Signed)
Subjective: ?Patient is a 46 y.o. male admitted for right S1 radiculopathy. Onset of symptoms was several months ago, gradually worsening since that time.  The pain is rated severe, and is located at the across the lower back and radiates to right posterior thigh. The pain is described as aching and occurs all day. The symptoms have been progressive. Symptoms are exacerbated by exercise, standing, and walking for more than a few minutes. MRI or CT showed recurrent disc herniation L5-S1 right.  He also has an extraforaminal disc herniation at L4-5 on the right but we felt like his pain was more consistent with S1 radicular pain ? ?History reviewed. No pertinent past medical history.  ?Past Surgical History:  ?Procedure Laterality Date  ? BACK SURGERY  05/18/2021  ? INGUINAL HERNIA REPAIR Right 09/1998  ?  ?Prior to Admission medications   ?Medication Sig Start Date End Date Taking? Authorizing Provider  ?DULoxetine (CYMBALTA) 30 MG capsule Take 1 capsule (30 mg total) by mouth daily. 01/01/22  Yes Janith Lima, MD  ?ibuprofen (ADVIL) 200 MG tablet Take 800 mg by mouth every 8 (eight) hours as needed (for pain.).   Yes [provider]  ?QUEtiapine (SEROQUEL) 25 MG tablet Take 1 tablet (25 mg total) by mouth at bedtime. 01/01/22  Yes Janith Lima, MD  ?rosuvastatin (CRESTOR) 20 MG tablet Take 1 tablet (20 mg total) by mouth daily. ?Patient taking differently: Take 20 mg by mouth every evening. 01/01/22  Yes Janith Lima, MD  ? ?No Known Allergies  ?Social History  ? ?Tobacco Use  ? Smoking status: Every Day  ?  Packs/day: 1.00  ?  Years: 30.00  ?  Pack years: 30.00  ?  Types: Cigarettes  ? Smokeless tobacco: Current  ?Substance Use Topics  ? Alcohol use: Not Currently  ?  ?Family History  ?Problem Relation Age of Onset  ? Hypertension Mother   ? Osteoarthritis Mother   ? Cancer Father   ?     pancreas  ? ?  ?Review of Systems ? ?Positive ROS: neg ? ?All other systems have been reviewed and were otherwise  negative with the exception of those mentioned in the HPI and as above. ? ?Objective: ?Vital signs in last 24 hours: ?Temp:  [98 ?F (36.7 ?C)] 98 ?F (36.7 ?C) (03/08 7616) ?Pulse Rate:  [91] 91 (03/08 0959) ?Resp:  [18] 18 (03/08 0959) ?BP: (161)/(115) 161/115 (03/08 0959) ?SpO2:  [96 %] 96 % (03/08 0959) ?Weight:  [113.4 kg-114 kg] 114 kg (03/08 1000) ? ?General Appearance: Alert, cooperative, no distress, appears stated age ?Head: Normocephalic, without obvious abnormality, atraumatic ?Eyes: PERRL, conjunctiva/corneas clear, EOM's intact    ?Neck: Supple, symmetrical, trachea midline ?Back: Symmetric, no curvature, ROM normal, no CVA tenderness ?Lungs:  respirations unlabored ?Heart: Regular rate and rhythm ?Abdomen: Soft, non-tender ?Extremities: Extremities normal, atraumatic, no cyanosis or edema ?Pulses: 2+ and symmetric all extremities ?Skin: Skin color, texture, turgor normal, no rashes or lesions ? ?NEUROLOGIC:  ? ?Mental status: Alert and oriented x4,  no aphasia, good attention span, fund of knowledge, and memory ?Motor Exam - grossly normal ?Sensory Exam - grossly normal ?Reflexes: 1+ ?Coordination - grossly normal ?Gait - grossly normal ?Balance - grossly normal ?Cranial Nerves: ?I: smell Not tested  ?II: visual acuity  OS: nl    OD: nl  ?II: visual fields Full to confrontation  ?II: pupils Equal, round, reactive to light  ?III,VII: ptosis None  ?III,IV,VI: extraocular muscles  Full ROM  ?  V: mastication Normal  ?V: facial light touch sensation  Normal  ?V,VII: corneal reflex  Present  ?VII: facial muscle function - upper  Normal  ?VII: facial muscle function - lower Normal  ?VIII: hearing Not tested  ?IX: soft palate elevation  Normal  ?IX,X: gag reflex Present  ?XI: trapezius strength  5/5  ?XI: sternocleidomastoid strength 5/5  ?XI: neck flexion strength  5/5  ?XII: tongue strength  Normal  ? ? ?Data Review ?Lab Results  ?Component Value Date  ? WBC 9.1 01/01/2022  ? HGB 17.9 Repeated and verified X2.  (H) 01/01/2022  ? HCT 53.8 (H) 01/01/2022  ? MCV 95.5 01/01/2022  ? PLT 228.0 01/01/2022  ? ?Lab Results  ?Component Value Date  ? NA 139 01/01/2022  ? K 4.0 01/01/2022  ? CL 102 01/01/2022  ? CO2 31 01/01/2022  ? BUN 10 01/01/2022  ? CREATININE 0.90 01/01/2022  ? GLUCOSE 99 01/01/2022  ? ?Lab Results  ?Component Value Date  ? INR 1.0 01/13/2022  ? ? ?Assessment/Plan: ? ?Estimated body mass index is 34.09 kg/m? as calculated from the following: ?  Height as of this encounter: 6' (1.829 m). ?  Weight as of this encounter: 114 kg. ?Patient admitted for redo right L5-S1 microdiscectomy. Patient has failed a reasonable attempt at conservative therapy. ? ?I explained the condition and procedure to the patient and answered any questions.  Patient wishes to proceed with procedure as planned. Understands risks/ benefits and typical outcomes of procedure. ? ? ?Anthony Nielsen ?01/13/2022 11:27 AM ? ?

## 2022-01-13 NOTE — Anesthesia Postprocedure Evaluation (Signed)
Anesthesia Post Note ? ?Patient: BRYKER FLETCHALL ? ?Procedure(s) Performed: Redo Microdiscectomy  - Lumbar five-Sacral one - right (Right: Spine Lumbar) ? ?  ? ?Patient location during evaluation: PACU ?Anesthesia Type: General ?Level of consciousness: awake and alert ?Pain management: pain level controlled ?Vital Signs Assessment: post-procedure vital signs reviewed and stable ?Respiratory status: spontaneous breathing, nonlabored ventilation, respiratory function stable and patient connected to nasal cannula oxygen ?Cardiovascular status: blood pressure returned to baseline and stable ?Postop Assessment: no apparent nausea or vomiting ?Anesthetic complications: no ? ? ?No notable events documented. ? ?Last Vitals:  ?Vitals:  ? 01/13/22 1345 01/13/22 1400  ?BP: (!) 149/103 (!) 148/94  ?Pulse: 76 77  ?Resp: 16 12  ?Temp:    ?SpO2:  93%  ?  ?Last Pain:  ?Vitals:  ? 01/13/22 1400  ?TempSrc:   ?PainSc: Asleep  ? ? ?LLE Motor Response: Purposeful movement (01/13/22 1400) ?LLE Sensation: Full sensation (01/13/22 1400) ?RLE Motor Response: Purposeful movement (01/13/22 1400) ?RLE Sensation: Full sensation (01/13/22 1400) ?  ?  ? ?Kelten Enochs,W. EDMOND ? ? ? ? ?

## 2022-01-13 NOTE — Transfer of Care (Signed)
Immediate Anesthesia Transfer of Care Note ? ?Patient: Anthony Nielsen ? ?Procedure(s) Performed: Redo Microdiscectomy  - Lumbar five-Sacral one - right (Right: Spine Lumbar) ? ?Patient Location: PACU ? ?Anesthesia Type:General ? ?Level of Consciousness: awake, alert  and oriented ? ?Airway & Oxygen Therapy: Patient Spontanous Breathing and Patient connected to nasal cannula oxygen ? ?Post-op Assessment: Report given to RN and Post -op Vital signs reviewed and stable ? ?Post vital signs: Reviewed and stable ? ?Last Vitals:  ?Vitals Value Taken Time  ?BP 145/97 01/13/22 1328  ?Temp    ?Pulse 88 01/13/22 1330  ?Resp 13 01/13/22 1331  ?SpO2 93 % 01/13/22 1330  ?Vitals shown include unvalidated device data. ? ?Last Pain:  ?Vitals:  ? 01/13/22 1008  ?TempSrc:   ?PainSc: 8   ?   ? ?Patients Stated Pain Goal: 5 (01/13/22 1008) ? ?Complications: No notable events documented. ?

## 2022-01-13 NOTE — Anesthesia Procedure Notes (Signed)
Procedure Name: Intubation ?Date/Time: 01/13/2022 12:03 PM ?Performed by: Janene Harvey, CRNA ?Pre-anesthesia Checklist: Patient identified, Emergency Drugs available, Suction available and Patient being monitored ?Patient Re-evaluated:Patient Re-evaluated prior to induction ?Oxygen Delivery Method: Circle system utilized ?Preoxygenation: Pre-oxygenation with 100% oxygen ?Induction Type: IV induction ?Ventilation: Two handed mask ventilation required and Oral airway inserted - appropriate to patient size ?Laryngoscope Size: Mac and 4 ?Grade View: Grade III ?Tube type: Oral ?Tube size: 7.5 mm ?Number of attempts: 1 ?Airway Equipment and Method: Stylet and Oral airway ?Placement Confirmation: ETT inserted through vocal cords under direct vision, positive ETCO2 and breath sounds checked- equal and bilateral ?Secured at: 23 cm ?Tube secured with: Tape ?Dental Injury: Teeth and Oropharynx as per pre-operative assessment  ? ? ? ? ?

## 2022-01-13 NOTE — Anesthesia Preprocedure Evaluation (Addendum)
Anesthesia Evaluation  ?Patient identified by MRN, date of birth, ID band ?Patient awake ? ? ? ?Reviewed: ?Allergy & Precautions, H&P , NPO status , Patient's Chart, lab work & pertinent test results ? ?Airway ?Mallampati: III ? ?TM Distance: >3 FB ?Neck ROM: Full ? ? ? Dental ?no notable dental hx. ?(+) Teeth Intact, Dental Advisory Given ?  ?Pulmonary ?Current Smoker and Patient abstained from smoking.,  ?  ?Pulmonary exam normal ?breath sounds clear to auscultation ? ? ? ? ? ? Cardiovascular ?negative cardio ROS ? ? ?Rhythm:Regular Rate:Normal ? ? ?  ?Neuro/Psych ?Depression negative neurological ROS ?   ? GI/Hepatic ?negative GI ROS, Neg liver ROS,   ?Endo/Other  ?negative endocrine ROS ? Renal/GU ?negative Renal ROS  ?negative genitourinary ?  ?Musculoskeletal ? ? Abdominal ?  ?Peds ? Hematology ?negative hematology ROS ?(+)   ?Anesthesia Other Findings ? ? Reproductive/Obstetrics ?negative OB ROS ? ?  ? ? ? ? ? ? ? ? ? ? ? ? ? ?  ?  ? ? ? ? ? ? ? ?Anesthesia Physical ?Anesthesia Plan ? ?ASA: 2 ? ?Anesthesia Plan: General  ? ?Post-op Pain Management:   ? ?Induction: Intravenous ? ?PONV Risk Score and Plan: 2 and Ondansetron, Dexamethasone and Midazolam ? ?Airway Management Planned: Oral ETT ? ?Additional Equipment:  ? ?Intra-op Plan:  ? ?Post-operative Plan: Extubation in OR ? ?Informed Consent: I have reviewed the patients History and Physical, chart, labs and discussed the procedure including the risks, benefits and alternatives for the proposed anesthesia with the patient or authorized representative who has indicated his/her understanding and acceptance.  ? ? ? ?Dental advisory given ? ?Plan Discussed with: CRNA ? ?Anesthesia Plan Comments:   ? ? ? ? ? ? ?Anesthesia Quick Evaluation ? ?

## 2022-01-13 NOTE — Op Note (Signed)
01/13/2022 ? ?1:14 PM ? ?PATIENT:  Anthony Nielsen  46 y.o. male ? 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 ?POST-OPERATIVE DIAGNOSIS:  same ? ?PROCEDURE: Redo right L5-S1 hemilaminectomy medial facetectomy foraminotomies followed by a microdiscectomy ? ?SURGEON:  Sherley Bounds, MD ? ?ASSISTANTS: Glenford Peers FNP ? ?ANESTHESIA:   General ? ?EBL: 50 ml ? ?Total I/O ?In: 1000 [I.V.:1000] ?Out: 50 [Blood:50] ? ?BLOOD ADMINISTERED: none ? ?DRAINS: None ? ?SPECIMEN:  none ? ?INDICATION FOR PROCEDURE: This patient presented with recurrent right leg pain down the back of his leg. Imaging showed small recurrent disc protrusion underneath the S1 nerve root. The patient tried conservative measures without relief. Pain was debilitating. Recommended redo microdiscectomy. Patient understood the risks, benefits, and alternatives and potential outcomes and wished to proceed. ? ?PROCEDURE DETAILS: The patient was taken to the operating room and after induction of adequate generalized endotracheal anesthesia, the patient was rolled into the prone position on the Zachman frame and all pressure points were padded. The lumbar region was cleaned and then prepped with DuraPrep and draped in the usual sterile fashion. 5 cc of local anesthesia was injected and then a dorsal midline incision was made and carried down to the lumbo sacral fascia. The fascia was opened and the paraspinous musculature was taken down in a subperiosteal fashion to expose L5-S1 on the right. Intraoperative x-ray confirmed my level, and then I used a nerve hook to gently the sweep the dura away from the underlying previous laminotomy, and then used a combination of the high-speed drill and the Kerrison punches to perform a repeat hemilaminectomy, medial facetectomy, and foraminotomy at 5 S1 on the right. The underlying yellow ligament that remains laterally was opened and removed in a piecemeal fashion to  expose the underlying dura and exiting nerve root. I was able to remove some of the scar from the surface of the nerve root in the lateral recess.  Undercut the lateral recess and dissected down until I was medial to and distal to the pedicle. The nerve root was well decompressed. We then gently retracted the nerve root medially with a retractor, coagulated the epidural venous vasculature, and incised the disc space. I performed a thorough intradiscal discectomy with pituitary rongeurs and curettes, until I had a nice decompression of the nerve root and the midline. I then palpated with a coronary dilator along the nerve root and into the foramen to assure adequate decompression. I felt no more compression of the nerve root. I irrigated with saline solution. Achieved hemostasis with bipolar cautery,  and then closed the fascia with 0 Vicryl. I closed the subcutaneous tissues with 2-0 Vicryl and the subcuticular tissues with 3-0 Vicryl. The skin was then closed with benzoin and Steri-Strips. The drapes were removed, a sterile dressing was applied.  My nurse practitioner was involved in the exposure, safe retraction of the neural elements, the disc work and the closure. the patient was awakened from general anesthesia and transferred to the recovery room in stable condition. At the end of the procedure all sponge, needle and instrument counts were correct. ? ? ? ?PLAN OF CARE: Admit for overnight observation ? ?PATIENT DISPOSITION:  PACU - hemodynamically stable. ?  ?Delay start of Pharmacological VTE agent (>24hrs) due to surgical blood loss or risk of bleeding:  yes ? ? ? ? ? ? ? ? ? ? ? ? ? ? ?

## 2022-01-13 NOTE — Discharge Summary (Signed)
Physician Discharge Summary  ?Patient ID: ?Anthony Nielsen ?MRN: [id number] ?DOB/AGE: 03/21/1976 46 y.o. ? ?Admit date: 01/13/2022 ?Discharge date: 01/13/2022 ? ?Admission Diagnoses: recurrent HNP L5-S1 R with readiculopathy  ? ? ?Discharge Diagnoses: same ? ? ?Discharged Condition: good ? ?Hospital Course: The patient was admitted on 01/13/2022 and taken to the operating room where the patient underwent redo microdiskectomy. The patient tolerated the procedure well and was taken to the recovery room and then to the floor in stable condition. The hospital course was routine. There were no complications. The wound remained clean dry and intact. Pt had appropriate back soreness. No complaints of leg pain or new N/T/W. The patient remained afebrile with stable vital signs, and tolerated a regular diet. The patient continued to increase activities, and pain was well controlled with oral pain medications.  ? ?Consults: None ? ?Significant Diagnostic Studies:  ?Results for orders placed or performed during the hospital encounter of 01/13/22  ?SARS Coronavirus 2 by RT PCR (hospital order, performed in [facility] hospital lab) Nasopharyngeal Nasopharyngeal Swab  ? Specimen: Nasopharyngeal Swab  ?Result Value Ref Range  ? SARS Coronavirus 2 NEGATIVE NEGATIVE  ?Protime-INR  ?Result Value Ref Range  ? Prothrombin Time 12.7 11.4 - 15.2 seconds  ? INR 1.0 0.8 - 1.2  ? ? ?DG Lumbar Spine 1 View ? ?Result Date: 01/13/2022 ?CLINICAL DATA:  Redo micro discectomy. EXAM: LUMBAR SPINE - 1 VIEW COMPARISON:  May 18, 2021 FINDINGS: Single lateral view of the lumbosacral spine demonstrates surgical instruments at the level of L5-S1 disc space posteriorly. IMPRESSION: Surgical instruments at the level of L5-S1 disc space posteriorly. Electronically Signed   By: Fidela Salisbury M.D.   On: 01/13/2022 13:43   ? ?Antibiotics:  ?Anti-infectives (From admission, onward)  ? ? Start     Dose/Rate Route Frequency Ordered Stop  ? 01/13/22 1000   ceFAZolin (ANCEF) IVPB 2g/100 mL premix       ? 2 g ?200 mL/hr over 30 Minutes Intravenous On call to O.R. 01/13/22 0957 01/13/22 1210  ? ?  ? ? ?Discharge Exam: ?Blood pressure (!) 143/97, pulse 73, temperature 98.2 ?F (36.8 ?C), resp. rate 18, height 6' (1.829 m), weight 114 kg, SpO2 96 %. ?Neurologic: Grossly normal ?Dressing dry ? ?Discharge Medications:   ?Allergies as of 01/13/2022   ?No Known Allergies ?  ? ?  ?Medication List  ?  ? ?TAKE these medications   ? ?DULoxetine 30 MG capsule ?Commonly known as: Cymbalta ?Take 1 capsule (30 mg total) by mouth daily. ?  ?HYDROcodone-acetaminophen 5-325 MG tablet ?Commonly known as: NORCO/VICODIN ?Take 1-2 tablets by mouth every 4 (four) hours as needed for moderate pain. ?  ?ibuprofen 200 MG tablet ?Commonly known as: ADVIL ?Take 800 mg by mouth every 8 (eight) hours as needed (for pain.). ?  ?QUEtiapine 25 MG tablet ?Commonly known as: SEROquel ?Take 1 tablet (25 mg total) by mouth at bedtime. ?  ?rosuvastatin 20 MG tablet ?Commonly known as: CRESTOR ?Take 1 tablet (20 mg total) by mouth daily. ?What changed: when to take this ?  ? ?  ? ? ?Disposition: home  ? ?Final Dx: redo R L5-S1 microdiskectomy ? ?Discharge Instructions   ? ?  Remove dressing in 72 hours   Complete by: As directed ?  ? Call MD for:  difficulty breathing, headache or visual disturbances   Complete by: As directed ?  ? Call MD for:  persistant nausea and vomiting   Complete by: As directed ?  ?  Call MD for:  redness, tenderness, or signs of infection (pain, swelling, redness, odor or green/yellow discharge around incision site)   Complete by: As directed ?  ? Call MD for:  severe uncontrolled pain   Complete by: As directed ?  ? Call MD for:  temperature >100.4   Complete by: As directed ?  ? Diet - low sodium heart healthy   Complete by: As directed ?  ? Increase activity slowly   Complete by: As directed ?  ? ?  ? ? ? Follow-up Information   ? ? [name], MD. Schedule an appointment as soon  as possible for a visit in 2 week(s).   ?Specialty: Neurosurgery ?Contact information: ?[address] ?[address] ?[place] [address] ?[phone] ? ? ?  ?  ? ?  ?  ? ?  ? ? ? ?Signed: ?[name] ?01/13/2022, 2:57 PM ? ? ?

## 2022-01-14 ENCOUNTER — Encounter (HOSPITAL_COMMUNITY): Payer: Self-pay | Admitting: Neurological Surgery

## 2022-01-14 ENCOUNTER — Other Ambulatory Visit: Payer: Self-pay | Admitting: Internal Medicine

## 2022-01-14 ENCOUNTER — Other Ambulatory Visit: Payer: Self-pay

## 2022-01-14 DIAGNOSIS — F331 Major depressive disorder, recurrent, moderate: Secondary | ICD-10-CM

## 2022-01-14 MED FILL — Thrombin For Soln 5000 Unit: CUTANEOUS | Qty: 2 | Status: AC

## 2022-01-21 ENCOUNTER — Other Ambulatory Visit: Payer: Self-pay

## 2022-01-21 ENCOUNTER — Ambulatory Visit (INDEPENDENT_AMBULATORY_CARE_PROVIDER_SITE_OTHER)
Admission: RE | Admit: 2022-01-21 | Discharge: 2022-01-21 | Disposition: A | Discharge status: Home / Self Care | Payer: No Typology Code available for payment source | Source: Ambulatory Visit | Attending: Internal Medicine | Admitting: Internal Medicine

## 2022-01-21 DIAGNOSIS — G8929 Other chronic pain: Secondary | ICD-10-CM

## 2022-01-21 DIAGNOSIS — R7989 Other specified abnormal findings of blood chemistry: Secondary | ICD-10-CM

## 2022-01-21 DIAGNOSIS — R109 Unspecified abdominal pain: Secondary | ICD-10-CM | POA: Diagnosis not present

## 2022-01-21 MED ORDER — IOHEXOL 300 MG/ML  SOLN
100.0000 mL | Freq: Once | INTRAMUSCULAR | Status: AC | PRN
  Administered 2022-01-21: 100 mL via INTRAVENOUS

## 2022-01-24 ENCOUNTER — Other Ambulatory Visit: Payer: Self-pay | Admitting: Internal Medicine

## 2022-01-24 ENCOUNTER — Encounter: Payer: Self-pay | Admitting: Internal Medicine

## 2022-01-24 DIAGNOSIS — D3501 Benign neoplasm of right adrenal gland: Secondary | ICD-10-CM | POA: Insufficient documentation

## 2022-01-24 NOTE — Progress Notes (Unsigned)
CT Abdomen Pelvis W Contrast ? ?Result Date: 01/24/2022 ?CLINICAL DATA:  Flank and abdominal pain, concern for nephrolithiasis EXAM: CT ABDOMEN AND PELVIS WITH CONTRAST TECHNIQUE: Multidetector CT imaging of the abdomen and pelvis was performed using the standard protocol following bolus administration of intravenous contrast. RADIATION DOSE REDUCTION: This exam was performed according to the departmental dose-optimization program which includes automated exposure control, adjustment of the mA and/or kV according to patient size and/or use of iterative reconstruction technique. CONTRAST:  162m OMNIPAQUE IOHEXOL 300 MG/ML  SOLN COMPARISON:  None. FINDINGS: Lower chest: No acute abnormality. Hepatobiliary: Diffuse hepatic hypoattenuation compatible with hepatic steatosis. No focal hepatic abnormality or biliary obstruction pattern. Gallbladder nondistended. Common bile duct nondilated. Pancreas: Unremarkable. No pancreatic ductal dilatation or surrounding inflammatory changes. Spleen: Normal in size without focal abnormality. Adrenals/Urinary Tract: 2.7 cm hypodense well-circumscribed right adrenal nodule. Normal left adrenal gland. No acute obstructive uropathy, hydronephrosis, focal renal abnormality, associated hydroureter, or ureteral calculus. Bladder unremarkable. Stomach/Bowel: Stomach is within normal limits. Appendix appears normal. No evidence of bowel wall thickening, distention, or inflammatory changes. Vascular/Lymphatic: Minor aortoiliac atherosclerosis without aneurysm. Negative for dissection. No retroperitoneal hemorrhage or hematoma. No adenopathy. No veno-occlusive process. Mesenteric and renal vasculature all appear patent. Reproductive: No significant finding by CT. Other: No abdominal wall hernia or abnormality. No abdominopelvic ascites. Musculoskeletal: Degenerative changes of the lower lumbar spine and multilevel lumbar facet arthropathy. Postop changes from recent lumbar spine surgery with a  trace amount of lumbar region subcutaneous edema and soft tissue air, image 62/2. IMPRESSION: No acute intra-abdominopelvic finding by contrast CT. Negative for obstructive uropathy, hydronephrosis, and nephrolithiasis. Mild hepatic steatosis Indeterminate 2.7 cm well-circumscribed right adrenal nodule favored to be adenoma. No available comparison studies. Recommend follow-up nonemergent abdominal MRI adrenal protocol for confirmation. Postop changes from recent lower lumbar spinal surgery. Aortic Atherosclerosis (ICD10-I70.0). Electronically Signed   By: MJerilynn Mages  Shick M.D.   On: 01/24/2022 09:10    ?

## 2022-02-02 ENCOUNTER — Other Ambulatory Visit: Payer: Self-pay | Admitting: Internal Medicine

## 2022-02-02 DIAGNOSIS — M545 Low back pain, unspecified: Secondary | ICD-10-CM

## 2022-02-02 DIAGNOSIS — F331 Major depressive disorder, recurrent, moderate: Secondary | ICD-10-CM

## 2022-02-03 ENCOUNTER — Other Ambulatory Visit: Payer: Self-pay | Admitting: Internal Medicine

## 2022-02-03 DIAGNOSIS — F331 Major depressive disorder, recurrent, moderate: Secondary | ICD-10-CM

## 2022-02-03 MED ORDER — DULOXETINE HCL 60 MG PO CPEP: 60.0000 mg | ORAL_CAPSULE | Freq: Every day | ORAL | 1 refills | Status: DC

## 2022-02-03 MED ORDER — QUETIAPINE FUMARATE 25 MG PO TABS: ORAL_TABLET | ORAL | 1 refills | Status: DC

## 2022-02-15 ENCOUNTER — Encounter: Payer: Self-pay | Admitting: Internal Medicine

## 2022-02-15 ENCOUNTER — Other Ambulatory Visit: Payer: Self-pay | Admitting: Internal Medicine

## 2022-02-15 DIAGNOSIS — F331 Major depressive disorder, recurrent, moderate: Secondary | ICD-10-CM

## 2022-02-15 MED ORDER — QUETIAPINE FUMARATE 50 MG PO TABS: 50.0000 mg | ORAL_TABLET | Freq: Every day | ORAL | 0 refills | Status: DC

## 2022-02-16 ENCOUNTER — Other Ambulatory Visit: Payer: Self-pay | Admitting: Internal Medicine

## 2022-02-16 DIAGNOSIS — F331 Major depressive disorder, recurrent, moderate: Secondary | ICD-10-CM

## 2022-02-23 ENCOUNTER — Other Ambulatory Visit: Payer: Self-pay | Admitting: Internal Medicine

## 2022-02-23 DIAGNOSIS — F331 Major depressive disorder, recurrent, moderate: Secondary | ICD-10-CM

## 2022-02-24 MED ORDER — QUETIAPINE FUMARATE 50 MG PO TABS: 50.0000 mg | ORAL_TABLET | Freq: Every day | ORAL | 0 refills | Status: DC

## 2022-03-01 ENCOUNTER — Encounter: Payer: Self-pay | Admitting: Internal Medicine

## 2022-03-01 ENCOUNTER — Ambulatory Visit (INDEPENDENT_AMBULATORY_CARE_PROVIDER_SITE_OTHER): Payer: No Typology Code available for payment source | Admitting: Internal Medicine

## 2022-03-01 ENCOUNTER — Ambulatory Visit
Admission: RE | Admit: 2022-03-01 | Discharge: 2022-03-01 | Disposition: A | Discharge status: Home / Self Care | Payer: No Typology Code available for payment source | Source: Ambulatory Visit | Attending: Internal Medicine | Admitting: Internal Medicine

## 2022-03-01 VITALS — BP 124/84 | HR 96 | Temp 98.2°F | Ht 72.0 in | Wt 238.0 lb

## 2022-03-01 DIAGNOSIS — K760 Fatty (change of) liver, not elsewhere classified: Secondary | ICD-10-CM

## 2022-03-01 DIAGNOSIS — D3501 Benign neoplasm of right adrenal gland: Secondary | ICD-10-CM

## 2022-03-01 DIAGNOSIS — I10 Essential (primary) hypertension: Secondary | ICD-10-CM | POA: Diagnosis not present

## 2022-03-01 DIAGNOSIS — F331 Major depressive disorder, recurrent, moderate: Secondary | ICD-10-CM

## 2022-03-01 MED ORDER — QUETIAPINE FUMARATE 50 MG PO TABS: 50.0000 mg | ORAL_TABLET | Freq: Every day | ORAL | 0 refills | Status: DC

## 2022-03-01 MED ORDER — GADOBENATE DIMEGLUMINE 529 MG/ML IV SOLN
20.0000 mL | Freq: Once | INTRAVENOUS | Status: AC | PRN
  Administered 2022-03-01: 20 mL via INTRAVENOUS

## 2022-03-01 MED ORDER — QUETIAPINE FUMARATE 50 MG PO TABS: 100.0000 mg | ORAL_TABLET | Freq: Every day | ORAL | 1 refills | Status: DC

## 2022-03-01 NOTE — Patient Instructions (Signed)
Fatty Liver Disease  The liver converts food into energy, removes toxic material from the blood, makes important proteins, and absorbs necessary vitamins from food. Fatty liver disease occurs when too much fat has built up in your liver cells. Fatty liver disease is also called hepatic steatosis. In many cases, fatty liver disease does not cause symptoms or problems. It is often diagnosed when tests are being done for other reasons. However, over time, fatty liver can cause inflammation that may lead to more serious liver problems, such as scarring of the liver (cirrhosis) and liver failure. Fatty liver is associated with insulin resistance, increased body fat, high blood pressure (hypertension), and high cholesterol. These are features of metabolic syndrome and increase your risk for stroke, diabetes, and heart disease. What are the causes? This condition may be caused by components of metabolic syndrome: Obesity. Insulin resistance. High cholesterol. Other causes: Alcohol abuse. Poor nutrition. Cushing syndrome. Pregnancy. Certain drugs. Poisons. Some viral infections. What increases the risk? You are more likely to develop this condition if you: Abuse alcohol. Are overweight. Have diabetes. Have hepatitis. Have a high triglyceride level. Are pregnant. What are the signs or symptoms? Fatty liver disease often does not cause symptoms. If symptoms do develop, they can include: Fatigue and weakness. Weight loss. Confusion. Nausea, vomiting, or abdominal pain. Yellowing of your skin and the white parts of your eyes (jaundice). Itchy skin. How is this diagnosed? This condition may be diagnosed by: A physical exam and your medical history. Blood tests. Imaging tests, such as an ultrasound, CT scan, or MRI. A liver biopsy. A small sample of liver tissue is removed using a needle. The sample is then looked at under a microscope. How is this treated? Fatty liver disease is often  caused by other health conditions. Treatment for fatty liver may involve medicines and lifestyle changes to manage conditions such as: Alcoholism. High cholesterol. Diabetes. Being overweight or obese. Follow these instructions at home:  Do not drink alcohol. If you have trouble quitting, ask your health care provider how to safely quit with the help of medicine or a supervised program. This is important to keep your condition from getting worse. Eat a healthy diet as told by your health care provider. Ask your health care provider about working with a dietitian to develop an eating plan. Exercise regularly. This can help you lose weight and control your cholesterol and diabetes. Talk to your health care provider about an exercise plan and which activities are best for you. Take over-the-counter and prescription medicines only as told by your health care provider. Keep all follow-up visits. This is important. Contact a health care provider if: You have trouble controlling your: Blood sugar. This is especially important if you have diabetes. Cholesterol. Drinking of alcohol. Get help right away if: You have abdominal pain. You have jaundice. You have nausea and are vomiting. You vomit blood or material that looks like coffee grounds. You have stools that are black, tar-like, or bloody. Summary Fatty liver disease develops when too much fat builds up in the cells of your liver. Fatty liver disease often causes no symptoms or problems. However, over time, fatty liver can cause inflammation that may lead to more serious liver problems, such as scarring of the liver (cirrhosis). You are more likely to develop this condition if you abuse alcohol, are pregnant, are overweight, have diabetes, have hepatitis, or have high triglyceride or cholesterol levels. Contact your health care provider if you have trouble controlling your blood   sugar, cholesterol, or drinking of alcohol. This information is  not intended to replace advice given to you by your health care provider. Make sure you discuss any questions you have with your health care provider. Document Revised: 08/07/2020 Document Reviewed: 08/07/2020 Elsevier Patient Education  2023 [employer] Inc.  

## 2022-03-01 NOTE — Progress Notes (Signed)
? ?Subjective:  ?Patient ID: Anthony Nielsen, male    DOB: [birth year]  Age: 46 y.o. MRN: [id number] ? ?CC: Hypertension ? ? ?HPI ?Anthony Nielsen presents for f/up - ? ?He would like to increase seroquel to 100 mg QD. The insomnia has improved and his mood is better. ? ?Outpatient Medications Prior to Visit  ?Medication Sig Dispense Refill  ? DULoxetine (CYMBALTA) 60 MG capsule Take 1 capsule (60 mg total) by mouth daily. 90 capsule 1  ? ibuprofen (ADVIL) 200 MG tablet Take 800 mg by mouth every 8 (eight) hours as needed (for pain.).    ? rosuvastatin (CRESTOR) 20 MG tablet Take 1 tablet (20 mg total) by mouth daily. (Patient taking differently: Take 20 mg by mouth every evening.) 90 tablet 1  ? HYDROcodone-acetaminophen (NORCO/VICODIN) 5-325 MG tablet Take 1-2 tablets by mouth every 4 (four) hours as needed for moderate pain. 40 tablet 0  ? QUEtiapine (SEROQUEL) 50 MG tablet Take 1 tablet (50 mg total) by mouth at bedtime. 90 tablet 0  ? ?No facility-administered medications prior to visit.  ? ? ?ROS ?Review of Systems  ?Constitutional:  Negative for diaphoresis and fatigue.  ?HENT: Negative.    ?Eyes: Negative.   ?Respiratory:  Negative for cough, chest tightness, shortness of breath and wheezing.   ?Cardiovascular:  Negative for chest pain, palpitations and leg swelling.  ?Gastrointestinal:  Negative for abdominal pain, constipation, diarrhea, nausea and vomiting.  ?Endocrine: Negative.   ?Genitourinary: Negative.  Negative for difficulty urinating.  ?Musculoskeletal:  Positive for back pain. Negative for arthralgias and myalgias.  ?Skin: Negative.   ?Neurological:  Negative for dizziness, weakness and light-headedness.  ?Hematological:  Negative for adenopathy. Does not bruise/bleed easily.  ?Psychiatric/Behavioral: Negative.    ? ?Objective:  ?BP 124/84 (BP Location: Left Arm, Patient Position: Sitting, Cuff Size: Large)   Pulse 96   Temp 98.2 ?F (36.8 ?C) (Oral)   Ht 6' (1.829 m)   Wt 238 lb (108 kg)    SpO2 95%   BMI 32.28 kg/m?  ? ?BP Readings from Last 3 Encounters:  ?03/01/22 124/84  ?01/13/22 (!) 143/97  ?01/01/22 (!) 132/94  ? ? ?Wt Readings from Last 3 Encounters:  ?03/01/22 238 lb (108 kg)  ?01/13/22 251 lb 5.2 oz (114 kg)  ?01/01/22 250 lb (113.4 kg)  ? ? ?Physical Exam ?Vitals reviewed.  ?HENT:  ?   Nose: Nose normal.  ?   Mouth/Throat:  ?   Mouth: Mucous membranes are moist.  ?Eyes:  ?   General: No scleral icterus. ?   Conjunctiva/sclera: Conjunctivae normal.  ?Cardiovascular:  ?   Rate and Rhythm: Normal rate and regular rhythm.  ?   Heart sounds: No murmur heard. ?Pulmonary:  ?   Effort: Pulmonary effort is normal.  ?   Breath sounds: No stridor. No wheezing, rhonchi or rales.  ?Abdominal:  ?   General: Abdomen is protuberant. Bowel sounds are normal. There is no distension.  ?   Palpations: Abdomen is soft. There is no hepatomegaly or splenomegaly.  ?Musculoskeletal:     ?   General: Normal range of motion.  ?   Cervical back: Neck supple.  ?   Right lower leg: No edema.  ?   Left lower leg: No edema.  ?Lymphadenopathy:  ?   Cervical: No cervical adenopathy.  ?Skin: ?   General: Skin is warm and dry.  ?Neurological:  ?   General: No focal deficit present.  ?   Mental Status:  He is alert.  ?Psychiatric:     ?   Mood and Affect: Mood normal.     ?   Behavior: Behavior normal.     ?   Thought Content: Thought content normal.     ?   Judgment: Judgment normal.  ? ? ?Lab Results  ?Component Value Date  ? WBC 9.1 01/01/2022  ? HGB 17.9 Repeated and verified X2. (H) 01/01/2022  ? HCT 53.8 (H) 01/01/2022  ? PLT 228.0 01/01/2022  ? GLUCOSE 99 01/01/2022  ? CHOL 283 (H) 01/01/2022  ? TRIG 254.0 (H) 01/01/2022  ? HDL 41.30 01/01/2022  ? LDLDIRECT 206.0 01/01/2022  ? ALT 90 (H) 01/01/2022  ? AST 34 01/01/2022  ? NA 139 01/01/2022  ? K 4.0 01/01/2022  ? CL 102 01/01/2022  ? CREATININE 0.90 01/01/2022  ? BUN 10 01/01/2022  ? CO2 31 01/01/2022  ? TSH 2.32 01/01/2022  ? PSA 0.16 01/01/2022  ? INR 1.0 01/13/2022   ? ? ?MR Abdomen W Wo Contrast ? ?Result Date: 03/01/2022 ?CLINICAL DATA:  Adrenal nodule EXAM: MRI ABDOMEN WITHOUT AND WITH CONTRAST TECHNIQUE: Multiplanar multisequence MR imaging of the abdomen was performed both before and after the administration of intravenous contrast. CONTRAST:  53m MULTIHANCE GADOBENATE DIMEGLUMINE 529 MG/ML IV SOLN COMPARISON:  CT abdomen and pelvis 01/21/2022 FINDINGS: Lower chest: No acute findings. Hepatobiliary: Liver is normal in size and contour with no suspicious mass identified. There is evidence of hepatic steatosis. Gallbladder appears normal. No biliary ductal dilatation identified. Pancreas: No mass, inflammatory changes, or other parenchymal abnormality identified. Spleen:  Within normal limits in size and appearance. Adrenals/Urinary Tract: 2.4 cm right adrenal gland nodule with out of phase signal dropout, consistent with adenoma. Left adrenal gland is normal. Kidneys appear normal. Stomach/Bowel: Visualized portions within the abdomen are unremarkable. Vascular/Lymphatic: No pathologically enlarged lymph nodes identified. No abdominal aortic aneurysm demonstrated. Other:  No ascites. Musculoskeletal: No suspicious bone lesions identified. IMPRESSION: 1. 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 2. Hepatic steatosis. Electronically Signed   By: Anthony NeasM.D.   On: 03/01/2022 09:21  ? ? ?Assessment & Plan:  ? ?CTermainewas seen today for hypertension. ? ?Diagnoses and all orders for this visit: ? ?Hypertension, unspecified type- His BP is well controlled. ? ?Moderate episode of recurrent major depressive disorder ([place] ?-     Discontinue: QUEtiapine (SEROQUEL) 50 MG tablet; Take 1 tablet (50 mg total) by mouth at bedtime. ?-     QUEtiapine (SEROQUEL) 50 MG tablet; Take 2 tablets (100 mg total) by mouth at bedtime. ? ?NAFLD (nonalcoholic fatty liver disease)- He is working on his lifestyle modifications. ? ? ?I have discontinued CDenton Meek Nielsen's  HYDROcodone-acetaminophen and QUEtiapine. I have also changed his QUEtiapine. Additionally, I am having him maintain his rosuvastatin, ibuprofen, and DULoxetine. ? ?Meds ordered this encounter  ?Medications  ? DISCONTD: QUEtiapine (SEROQUEL) 50 MG tablet  ?  Sig: Take 1 tablet (50 mg total) by mouth at bedtime.  ?  Dispense:  90 tablet  ?  Refill:  0  ? QUEtiapine (SEROQUEL) 50 MG tablet  ?  Sig: Take 2 tablets (100 mg total) by mouth at bedtime.  ?  Dispense:  180 tablet  ?  Refill:  1  ? ? ? ?Follow-up: Return in about 6 months (around 08/31/2022). ? ?TScarlette Calico MD ?

## 2022-03-03 DIAGNOSIS — K760 Fatty (change of) liver, not elsewhere classified: Secondary | ICD-10-CM | POA: Insufficient documentation

## 2022-03-24 ENCOUNTER — Ambulatory Visit: Payer: No Typology Code available for payment source | Admitting: Internal Medicine

## 2022-05-08 ENCOUNTER — Other Ambulatory Visit: Payer: Self-pay

## 2022-05-08 ENCOUNTER — Encounter (HOSPITAL_COMMUNITY): Payer: Self-pay | Admitting: Emergency Medicine

## 2022-05-08 ENCOUNTER — Ambulatory Visit (HOSPITAL_COMMUNITY)
Admission: EM | Admit: 2022-05-08 | Discharge: 2022-05-09 | Disposition: A | Discharge status: Home / Self Care | Payer: No Typology Code available for payment source | Attending: Family | Admitting: Family

## 2022-05-08 DIAGNOSIS — F1020 Alcohol dependence, uncomplicated: Secondary | ICD-10-CM | POA: Insufficient documentation

## 2022-05-08 DIAGNOSIS — F331 Major depressive disorder, recurrent, moderate: Secondary | ICD-10-CM | POA: Diagnosis not present

## 2022-05-08 DIAGNOSIS — F333 Major depressive disorder, recurrent, severe with psychotic symptoms: Secondary | ICD-10-CM | POA: Diagnosis present

## 2022-05-08 DIAGNOSIS — Z20822 Contact with and (suspected) exposure to covid-19: Secondary | ICD-10-CM | POA: Insufficient documentation

## 2022-05-08 DIAGNOSIS — Z79899 Other long term (current) drug therapy: Secondary | ICD-10-CM | POA: Diagnosis not present

## 2022-05-08 LAB — COMPREHENSIVE METABOLIC PANEL
ALT: 61 U/L — ABNORMAL HIGH (ref 0–44)
AST: 44 U/L — ABNORMAL HIGH (ref 15–41)
Albumin: 4.8 g/dL (ref 3.5–5.0)
Alkaline Phosphatase: 71 U/L (ref 38–126)
Anion gap: 12 (ref 5–15)
BUN: 11 mg/dL (ref 6–20)
CO2: 23 mmol/L (ref 22–32)
Calcium: 9 mg/dL (ref 8.9–10.3)
Chloride: 111 mmol/L (ref 98–111)
Creatinine, Ser: 0.88 mg/dL (ref 0.61–1.24)
GFR, Estimated: >60 mL/min (ref >60)
Glucose, Bld: 101 mg/dL — ABNORMAL HIGH (ref 70–99)
Potassium: 3.9 mmol/L (ref 3.5–5.1)
Sodium: 146 mmol/L — ABNORMAL HIGH (ref 135–145)
Total Bilirubin: 0.6 mg/dL (ref 0.3–1.2)
Total Protein: 7.7 g/dL (ref 6.5–8.1)

## 2022-05-08 LAB — LIPID PANEL
Cholesterol: 230 mg/dL — ABNORMAL HIGH (ref 0–200)
HDL: 41 mg/dL (ref >40)
LDL Cholesterol: 119 mg/dL — ABNORMAL HIGH (ref 0–99)
Total CHOL/HDL Ratio: 5.6 RATIO
Triglycerides: 352 mg/dL — ABNORMAL HIGH (ref <150)
VLDL: 70 mg/dL — ABNORMAL HIGH (ref 0–40)

## 2022-05-08 LAB — CBC WITH DIFFERENTIAL/PLATELET
Abs Immature Granulocytes: 0.19 10*3/uL — ABNORMAL HIGH (ref 0.00–0.07)
Basophils Absolute: 0.1 10*3/uL (ref 0.0–0.1)
Basophils Relative: 1 %
Eosinophils Absolute: 0.2 10*3/uL (ref 0.0–0.5)
Eosinophils Relative: 2 %
HCT: 51.2 % (ref 39.0–52.0)
Hemoglobin: 17.5 g/dL — ABNORMAL HIGH (ref 13.0–17.0)
Immature Granulocytes: 2 %
Lymphocytes Relative: 43 %
Lymphs Abs: 4.8 10*3/uL — ABNORMAL HIGH (ref 0.7–4.0)
MCH: 32.4 pg (ref 26.0–34.0)
MCHC: 34.2 g/dL (ref 30.0–36.0)
MCV: 94.8 fL (ref 80.0–100.0)
Monocytes Absolute: 0.8 10*3/uL (ref 0.1–1.0)
Monocytes Relative: 7 %
Neutro Abs: 5.2 10*3/uL (ref 1.7–7.7)
Neutrophils Relative %: 45 %
Platelets: 258 10*3/uL (ref 150–400)
RBC: 5.4 MIL/uL (ref 4.22–5.81)
RDW: 13.8 % (ref 11.5–15.5)
WBC: 11.3 10*3/uL — ABNORMAL HIGH (ref 4.0–10.5)
nRBC: 0 % (ref 0.0–0.2)

## 2022-05-08 LAB — RESP PANEL BY RT-PCR (FLU A&B, COVID) ARPGX2
Influenza A by PCR: NEGATIVE
Influenza B by PCR: NEGATIVE
SARS Coronavirus 2 by RT PCR: NEGATIVE

## 2022-05-08 LAB — POC SARS CORONAVIRUS 2 AG: SARSCOV2ONAVIRUS 2 AG: NEGATIVE

## 2022-05-08 LAB — TSH: TSH: 0.89 u[IU]/mL (ref 0.350–4.500)

## 2022-05-08 MED ORDER — ACETAMINOPHEN 325 MG PO TABS: 650.0000 mg | ORAL_TABLET | Freq: Four times a day (QID) | ORAL | Status: DC | PRN

## 2022-05-08 MED ORDER — CLONAZEPAM 0.5 MG PO TABS
0.5000 mg | ORAL_TABLET | Freq: Once | ORAL | Status: AC | PRN
  Administered 2022-05-08: 0.5 mg via ORAL
  Filled 2022-05-08: qty 1

## 2022-05-08 MED ORDER — MAGNESIUM HYDROXIDE 400 MG/5ML PO SUSP: 30.0000 mL | Freq: Every day | ORAL | Status: DC | PRN

## 2022-05-08 MED ORDER — TRAZODONE HCL 50 MG PO TABS
50.0000 mg | ORAL_TABLET | Freq: Every evening | ORAL | Status: DC | PRN
  Administered 2022-05-08: 50 mg via ORAL
  Filled 2022-05-08: qty 1

## 2022-05-08 MED ORDER — HYDROXYZINE HCL 25 MG PO TABS
25.0000 mg | ORAL_TABLET | Freq: Three times a day (TID) | ORAL | Status: DC | PRN
  Administered 2022-05-08 – 2022-05-09 (×2): 25 mg via ORAL
  Filled 2022-05-08 (×2): qty 1

## 2022-05-08 MED ORDER — ALUM & MAG HYDROXIDE-SIMETH 200-200-20 MG/5ML PO SUSP: 30.0000 mL | ORAL | Status: DC | PRN
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
## 2022-05-08 NOTE — ED Notes (Signed)
Pt received dinner. 
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
## 2022-05-08 NOTE — ED Notes (Signed)
Pt sleeping at present, no distress noted.  Monitoring for safety. 

## 2022-05-09 DIAGNOSIS — F331 Major depressive disorder, recurrent, moderate: Secondary | ICD-10-CM | POA: Diagnosis not present

## 2022-05-09 LAB — POCT URINE DRUG SCREEN - MANUAL ENTRY (I-SCREEN)
POC Amphetamine UR: NOT DETECTED
POC Buprenorphine (BUP): NOT DETECTED
POC Cocaine UR: NOT DETECTED
POC Marijuana UR: POSITIVE — AB
POC Methadone UR: NOT DETECTED
POC Methamphetamine UR: NOT DETECTED
POC Morphine: NOT DETECTED
POC Oxazepam (BZO): NOT DETECTED
POC Oxycodone UR: NOT DETECTED
POC Secobarbital (BAR): NOT DETECTED

## 2022-05-09 MED ORDER — DULOXETINE HCL 60 MG PO CPEP
60.0000 mg | ORAL_CAPSULE | Freq: Every day | ORAL | Status: DC
Start: 2022-05-09 — End: 2022-05-09
  Administered 2022-05-09: 60 mg via ORAL
  Filled 2022-05-09: qty 1

## 2022-05-09 MED ORDER — ROSUVASTATIN CALCIUM 20 MG PO TABS: 20.0000 mg | ORAL_TABLET | Freq: Every evening | ORAL | Status: DC

## 2022-05-09 MED ORDER — QUETIAPINE FUMARATE 100 MG PO TABS
100.0000 mg | ORAL_TABLET | Freq: Every day | ORAL | Status: DC
Start: 2022-05-09 — End: 2022-05-09

## 2022-05-09 NOTE — ED Notes (Signed)
Pt is currently speaking with pharmacy.

## 2022-05-09 NOTE — ED Notes (Signed)
Pt sleeping at present, no distress noted. Respirations even & unlabored.  Monitoring for safety. 

## 2022-05-09 NOTE — ED Provider Notes (Cosign Needed Addendum)
FBC/OBS ASAP Discharge Summary  Date and Time: 05/09/2022 11:52 AM  Name: Anthony Nielsen  MRN:  [id number]   Discharge Diagnoses:  Final diagnoses:  Severe episode of recurrent major depressive disorder, with psychotic features ([place])    Subjective: Anthony Nielsen reported " I was drinking and my mother got mad at me I may some poor choices."   Anthony Nielsen was seen and evaluated face-to-face.  He presents with a bright and remorseful affect.  Denying suicidal or homicidal ideations.  Denies auditory visual hallucinations.  Reports a history with substance abuse.  States he was recently at a treatment facility however relapse after returning home. "  I cannot remember Anthony Nielsen says some things to get my mother upset."  He reports he is followed by therapy and psychiatry currently where he is prescribed Cymbalta and Seroquel for sleep.  States he has been taking medications as indicated.  He reports previous inpatient admissions a few years ago.  No documented concerning behaviors during overnight observation.   Chart reviewed UDS positive for marijuana.  He declined additional outpatient resources for substance abuse and/or chemical dependency outpatient programming.  During evaluation Anthony Nielsen is sitting in no acute distress.  He is alert/oriented x 4; calm/cooperative; and mood congruent with affect.   High he is speaking in a clear tone at moderate volume, and normal pace; with good eye contact. His thought process is coherent and relevant; There is no indication thathe is currently responding to internal/external stimuli or experiencing delusional thought content; and he has denied suicidal/self-harm/homicidal ideation, psychosis, and paranoia.   Patient has remained calm throughout assessment and has answered questions appropriately.        Stay Summary:  Total Time spent with patient: 15 minutes  Past Psychiatric History:  Past Medical History: History reviewed. No pertinent past medical  history.  Past Surgical History:  Procedure Laterality Date   BACK SURGERY  05/18/2021   INGUINAL HERNIA REPAIR Right 09/1998   LUMBAR LAMINECTOMY/DECOMPRESSION MICRODISCECTOMY Right 01/13/2022   Procedure: Redo Microdiscectomy  - Lumbar five-Sacral one - right;  Surgeon: [name], MD;  Location: [place];  Service: Neurosurgery;  Laterality: Right;   Family History:  Family History  Problem Relation Age of Onset   Hypertension Mother    Osteoarthritis Mother    Cancer Father        pancreas   Family Psychiatric History:  Social History:  Social History   Substance and Sexual Activity  Alcohol Use Not Currently     Social History   Substance and Sexual Activity  Drug Use Not Currently    Social History   Socioeconomic History   Marital status: Single    Spouse name: Not on file   Number of children: Not on file   Years of education: Not on file   Highest education level: Not on file  Occupational History   Not on file  Tobacco Use   Smoking status: Every Day    Packs/day: 1.00    Years: 30.00    Total pack years: 30.00    Types: Cigarettes   Smokeless tobacco: Current  Vaping Use   Vaping Use: Never used  Substance and Sexual Activity   Alcohol use: Not Currently   Drug use: Not Currently   Sexual activity: Not Currently  Other Topics Concern   Not on file  Social History Narrative   Not on file   Social Determinants of Health   Financial Resource Strain: Not on file  Food Insecurity: Not on file  Transportation Needs: Not on file  Physical Activity: Not on file  Stress: Not on file  Social Connections: Not on file   SDOH:  SDOH Screenings   Alcohol Screen: Not on file  Depression (PHQ2-9): Low Risk  (03/01/2022)   Depression (PHQ2-9)    PHQ-2 Score: 0  Financial Resource Strain: Not on file  Food Insecurity: Not on file  Housing: Not on file  Physical Activity: Not on file  Social Connections: Not on file  Stress: Not on file  Tobacco  Use: High Risk (05/08/2022)   Patient History    Smoking Tobacco Use: Every Day    Smokeless Tobacco Use: Current    Passive Exposure: Not on file  Transportation Needs: Not on file    Tobacco Cessation:  N/A, patient does not currently use tobacco products  Current Medications:  Current Facility-Administered Medications  Medication Dose Route Frequency Provider Last Rate Last Admin   acetaminophen (TYLENOL) tablet 650 mg  650 mg Oral Q6H PRN Bobbitt, Shalon E, NP       alum & mag hydroxide-simeth (MAALOX/MYLANTA) 200-200-20 MG/5ML suspension 30 mL  30 mL Oral Q4H PRN Bobbitt, Shalon E, NP       DULoxetine (CYMBALTA) DR capsule 60 mg  60 mg Oral Daily Bobbitt, Shalon E, NP   60 mg at 05/09/22 8466   hydrOXYzine (ATARAX) tablet 25 mg  25 mg Oral TID PRN Bobbitt, Shalon E, NP   25 mg at 05/09/22 0906   magnesium hydroxide (MILK OF MAGNESIA) suspension 30 mL  30 mL Oral Daily PRN Bobbitt, Shalon E, NP       QUEtiapine (SEROQUEL) tablet 100 mg  100 mg Oral QHS Bobbitt, Shalon E, NP       rosuvastatin (CRESTOR) tablet 20 mg  20 mg Oral QPM Bobbitt, Shalon E, NP       traZODone (DESYREL) tablet 50 mg  50 mg Oral QHS PRN Bobbitt, Shalon E, NP   50 mg at 05/08/22 2201   Current Outpatient Medications  Medication Sig Dispense Refill   DULoxetine (CYMBALTA) 60 MG capsule Take 1 capsule (60 mg total) by mouth daily. 90 capsule 1   QUEtiapine (SEROQUEL) 50 MG tablet Take 2 tablets (100 mg total) by mouth at bedtime. 180 tablet 1   rosuvastatin (CRESTOR) 20 MG tablet Take 1 tablet (20 mg total) by mouth daily. (Patient taking differently: Take 20 mg by mouth every evening.) 90 tablet 1    PTA Medications: (Not in a hospital admission)      03/01/2022    2:43 PM 01/01/2022    9:37 AM  Depression screen PHQ 2/9  Decreased Interest 0 0  Down, Depressed, Hopeless 0 0  PHQ - 2 Score 0 0  Altered sleeping 0   Tired, decreased energy 0   Change in appetite 0   Feeling bad or failure about yourself   0   Trouble concentrating 0   Moving slowly or fidgety/restless 0   Suicidal thoughts 0   PHQ-9 Score 0     Flowsheet Row ED from 05/08/2022 in [place] Admission (Discharged) from 01/13/2022 in [place] No Risk No Risk       Musculoskeletal  Strength & Muscle Tone: within normal limits Gait & Station: normal Patient leans: N/A  Psychiatric Specialty Exam  Presentation  General Appearance: Appropriate for Environment  Eye Contact:Good  Speech:Clear and  Coherent  Speech Volume:Normal  Handedness:Right   Mood and Affect  Mood:Anxious  Affect:Congruent   Thought Process  Thought Processes:Coherent  Descriptions of Associations:Intact  Orientation:Full (Time, Place and Person)  Thought Content:Logical  Diagnosis of Schizophrenia or Schizoaffective disorder in past: No    Hallucinations:Hallucinations: None  Ideas of Reference:None  Suicidal Thoughts:Suicidal Thoughts: No  Homicidal Thoughts:Homicidal Thoughts: No   Sensorium  Memory:Remote Fair; Immediate Fair; Recent Fair  Judgment:Fair  Insight:Fair   Executive Functions  Concentration:Fair  Attention Span:Fair  [place] of Knowledge:Good  Language:Good   Psychomotor Activity  Psychomotor Activity:Psychomotor Activity: Normal   Assets  Assets:Social Support; Desire for Improvement   Sleep  Sleep:Sleep: Fair Number of Hours of Sleep: -1   Nutritional Assessment (For OBS and FBC admissions only) Has the patient had a weight loss or gain of 10 pounds or more in the last 3 months?: No Has the patient had a decrease in food intake/or appetite?: No Does the patient have dental problems?: No Does the patient have eating habits or behaviors that may be indicators of an eating disorder including binging or inducing vomiting?: No Has the patient recently lost weight without trying?: 0 Has  the patient been eating poorly because of a decreased appetite?: 0 Malnutrition Screening Tool Score: 0    Physical Exam  Physical Exam Vitals and nursing note reviewed.  Cardiovascular:     Rate and Rhythm: Normal rate and regular rhythm.  Neurological:     Mental Status: He is oriented to person, place, and time.  Psychiatric:        Mood and Affect: Mood normal.        Behavior: Behavior normal.    Review of Systems  HENT: Negative.    Cardiovascular: Negative.   Genitourinary: Negative.   Psychiatric/Behavioral:  Positive for depression. Negative for hallucinations and suicidal ideas. The patient is not nervous/anxious.   All other systems reviewed and are negative.  Blood pressure 130/79, pulse 100, temperature (!) 97.5 F (36.4 C), temperature source Oral, resp. rate 18, SpO2 97 %. There is no height or weight on file to calculate BMI.  Demographic Factors:  Male and Caucasian  Loss Factors: NA  Historical Factors: Family history of mental illness or substance abuse  Risk Reduction Factors:   Living with another person, especially a relative, Positive social support, and Positive therapeutic relationship  Continued Clinical Symptoms:  Depression:   Aggression  Cognitive Features That Contribute To Risk:  Closed-mindedness    Suicide Risk:  Minimal: No identifiable suicidal ideation.  Patients presenting with no risk factors but with morbid ruminations; may be classified as minimal risk based on the severity of the depressive symptoms  Plan Of Care/Follow-up recommendations:  Activity:  as tolerated Diet:  Heart healthy  -IVC to be rescinded  Disposition: Take all medications as prescribed. Keep all follow-up appointments as scheduled.  Do not consume alcohol or use illegal drugs while on prescription medications. Report any adverse effects from your medications to your primary care provider promptly.  In the event of recurrent symptoms or worsening  symptoms, call 911, a crisis hotline, or go to the nearest emergency department for evaluation.    Derrill Center, NP 05/09/2022, 11:52 AM

## 2022-05-09 NOTE — Discharge Instructions (Signed)
Take all medications as prescribed. Keep all follow-up appointments as scheduled.  Do not consume alcohol or use illegal drugs while on prescription medications. Report any adverse effects from your medications to your primary care provider promptly.  In the event of recurrent symptoms or worsening symptoms, call 911, a crisis hotline, or go to the nearest emergency department for evaluation.   

## 2022-05-09 NOTE — ED Notes (Signed)
Pt approached earlier this am and asks '' when do I get out of here. '' Patient reports he has longstanding conflict with his mother, with whom he resides and that '' we argue, and she has a drinking problem too. My situation is just complicated. '' Patient endorses he feels he does have a drinking '' problem '' and that he drank a fifth of liquor prior to arrival but that he does not '' drink daily or want any treatment '' patient adamantly denies any SI or HI and when confronted regarding what mother reported about him having HI he denies and reports mother has SA alcohol use issues . Patient does not appear to be responding to internal stimuli, does report some anxiety and prn vistaril given with good effect. Pt is safe, will con't to monitor.

## 2022-05-09 NOTE — Progress Notes (Signed)
Order received for pt discharge. AVS printed and added additional outpatient therapy resources. Patient denies any SI or HI and states he is ready to go. Pt belongings returned. Patient escorted from unit to lobby.No signs of acute decompensation.

## 2022-05-27 ENCOUNTER — Telehealth (HOSPITAL_COMMUNITY): Payer: Self-pay | Admitting: Internal Medicine

## 2022-05-27 NOTE — BH Assessment (Signed)
Care Management - [place] Follow Up Discharges   Writer made contact with the patient.  Patient reports that he is still in the process of scheduling a appointment with a therapist.

## 2022-07-22 ENCOUNTER — Ambulatory Visit: Payer: Self-pay | Admitting: Internal Medicine

## 2022-08-02 ENCOUNTER — Ambulatory Visit: Payer: Self-pay | Admitting: Internal Medicine

## 2022-08-21 ENCOUNTER — Other Ambulatory Visit: Payer: Self-pay | Admitting: Internal Medicine

## 2022-08-21 DIAGNOSIS — E7801 Familial hypercholesterolemia: Secondary | ICD-10-CM

## 2022-09-13 ENCOUNTER — Other Ambulatory Visit: Payer: Self-pay | Admitting: Internal Medicine

## 2022-09-13 DIAGNOSIS — F331 Major depressive disorder, recurrent, moderate: Secondary | ICD-10-CM

## 2022-10-04 ENCOUNTER — Other Ambulatory Visit: Payer: Self-pay | Admitting: Internal Medicine

## 2022-10-04 DIAGNOSIS — F331 Major depressive disorder, recurrent, moderate: Secondary | ICD-10-CM

## 2022-10-09 ENCOUNTER — Other Ambulatory Visit: Payer: Self-pay | Admitting: Internal Medicine

## 2022-10-09 DIAGNOSIS — F331 Major depressive disorder, recurrent, moderate: Secondary | ICD-10-CM

## 2022-11-08 ENCOUNTER — Other Ambulatory Visit: Payer: Self-pay | Admitting: Internal Medicine

## 2022-11-08 DIAGNOSIS — E7801 Familial hypercholesterolemia: Secondary | ICD-10-CM

## 2022-11-11 ENCOUNTER — Other Ambulatory Visit: Payer: Self-pay | Admitting: Internal Medicine

## 2022-11-11 DIAGNOSIS — F331 Major depressive disorder, recurrent, moderate: Secondary | ICD-10-CM

## 2022-11-21 ENCOUNTER — Other Ambulatory Visit: Payer: Self-pay | Admitting: Internal Medicine

## 2022-11-21 DIAGNOSIS — F331 Major depressive disorder, recurrent, moderate: Secondary | ICD-10-CM

## 2022-12-05 ENCOUNTER — Other Ambulatory Visit: Payer: Self-pay | Admitting: Internal Medicine

## 2022-12-05 DIAGNOSIS — F331 Major depressive disorder, recurrent, moderate: Secondary | ICD-10-CM

## 2022-12-12 ENCOUNTER — Other Ambulatory Visit: Payer: Self-pay | Admitting: Internal Medicine

## 2022-12-12 DIAGNOSIS — F331 Major depressive disorder, recurrent, moderate: Secondary | ICD-10-CM

## 2022-12-13 MED ORDER — QUETIAPINE FUMARATE 50 MG PO TABS: 100.0000 mg | ORAL_TABLET | Freq: Every day | ORAL | 0 refills | Status: DC

## 2023-01-10 ENCOUNTER — Other Ambulatory Visit: Payer: Self-pay | Admitting: Internal Medicine

## 2023-01-10 DIAGNOSIS — F331 Major depressive disorder, recurrent, moderate: Secondary | ICD-10-CM

## 2023-01-14 ENCOUNTER — Other Ambulatory Visit: Payer: Self-pay | Admitting: Internal Medicine

## 2023-01-14 DIAGNOSIS — F331 Major depressive disorder, recurrent, moderate: Secondary | ICD-10-CM

## 2023-01-24 ENCOUNTER — Other Ambulatory Visit: Payer: Self-pay | Admitting: Internal Medicine

## 2023-01-24 DIAGNOSIS — F331 Major depressive disorder, recurrent, moderate: Secondary | ICD-10-CM

## 2023-01-25 MED ORDER — QUETIAPINE FUMARATE 50 MG PO TABS: 100.0000 mg | ORAL_TABLET | Freq: Every day | ORAL | 0 refills | Status: DC

## 2023-02-06 ENCOUNTER — Other Ambulatory Visit: Payer: Self-pay | Admitting: Internal Medicine

## 2023-02-06 DIAGNOSIS — F331 Major depressive disorder, recurrent, moderate: Secondary | ICD-10-CM

## 2023-02-28 ENCOUNTER — Other Ambulatory Visit: Payer: Self-pay | Admitting: Internal Medicine

## 2023-02-28 DIAGNOSIS — F331 Major depressive disorder, recurrent, moderate: Secondary | ICD-10-CM

## 2023-03-01 ENCOUNTER — Other Ambulatory Visit: Payer: Self-pay | Admitting: Internal Medicine

## 2023-03-01 DIAGNOSIS — F331 Major depressive disorder, recurrent, moderate: Secondary | ICD-10-CM

## 2023-03-02 ENCOUNTER — Other Ambulatory Visit: Payer: Self-pay | Admitting: Internal Medicine

## 2023-03-02 ENCOUNTER — Encounter: Payer: Self-pay | Admitting: Internal Medicine

## 2023-03-02 DIAGNOSIS — F331 Major depressive disorder, recurrent, moderate: Secondary | ICD-10-CM

## 2023-03-09 ENCOUNTER — Ambulatory Visit: Payer: Self-pay | Admitting: Internal Medicine

## 2023-03-12 IMAGING — CT CT ABD-PELV W/ CM
2 of 5 series · 15 of 46 positions shown, 17 images · IV contrast (OMNIPAQUE 300)
Comparison: None.

CLINICAL DATA: Flank and abdominal pain, concern for
nephrolithiasis

EXAM:
CT ABDOMEN AND PELVIS WITH CONTRAST
TECHNIQUE: Multidetector CT imaging of the abdomen and pelvis was performed
using the standard protocol following bolus administration of
intravenous contrast.

[Series 2: abd/pel w · axial · 0.86mm/px · z∈[-524,-50]mm · 12 of 107 slices shown, 14 images]
[im 6/107  soft-tissue]
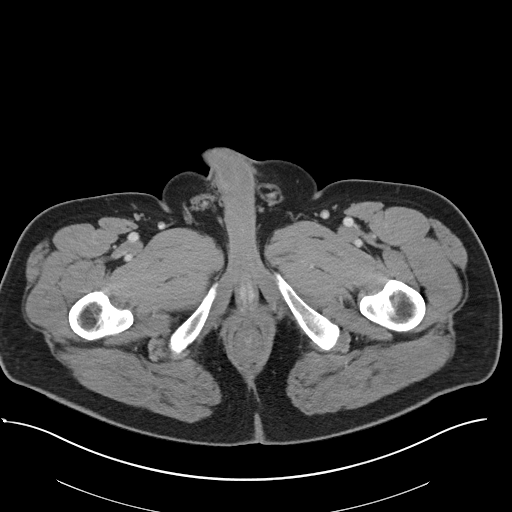
[im 6/107  bone]
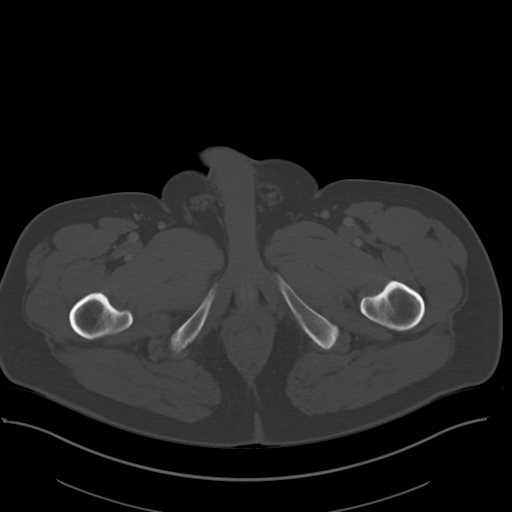
[im 18/107  soft-tissue]
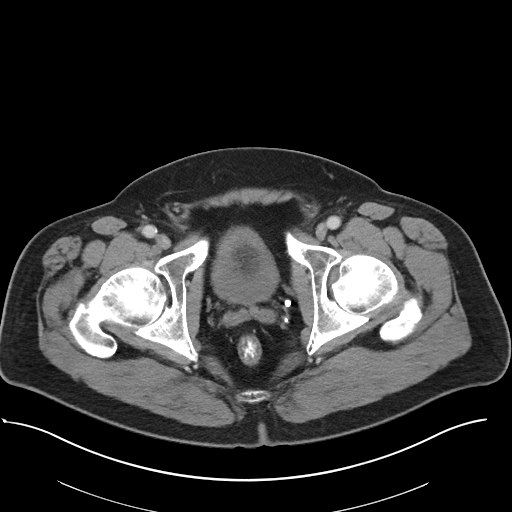
[im 24/107  soft-tissue]
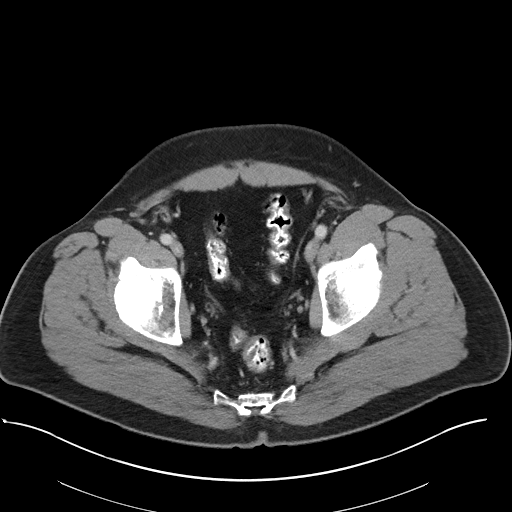
[im 30/107  soft-tissue]
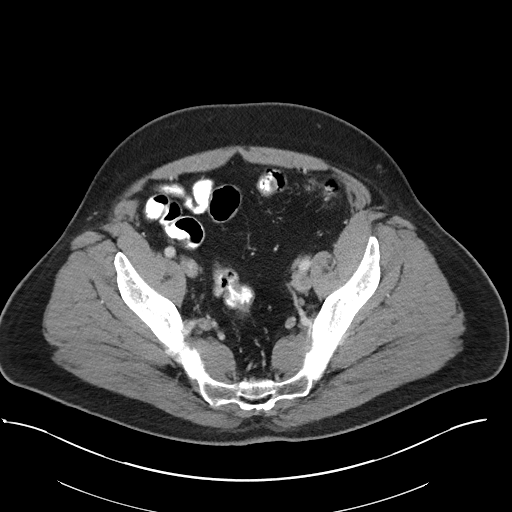
[im 42/107  soft-tissue]
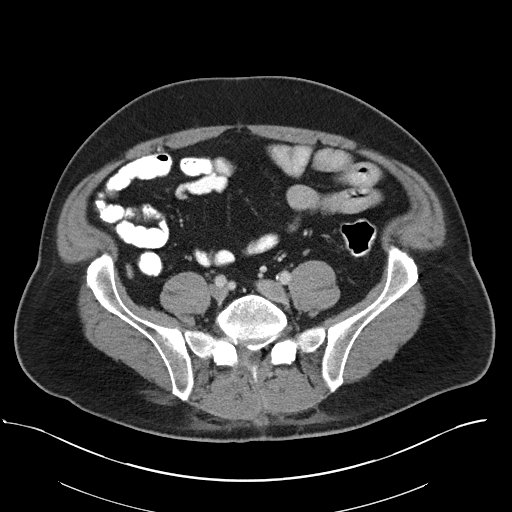
[im 48/107  soft-tissue]
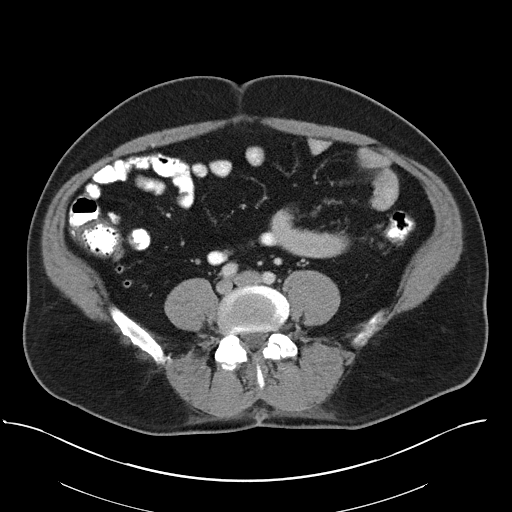
[im 59/107  soft-tissue]
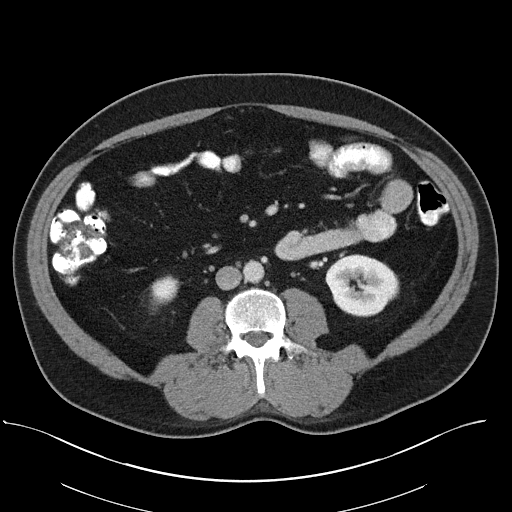
[im 65/107  soft-tissue]
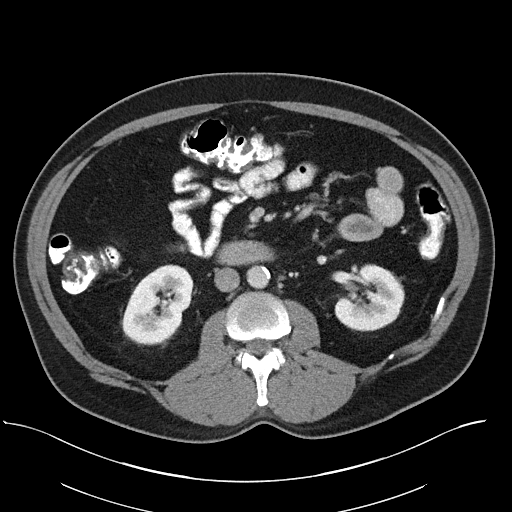
[im 77/107  soft-tissue]
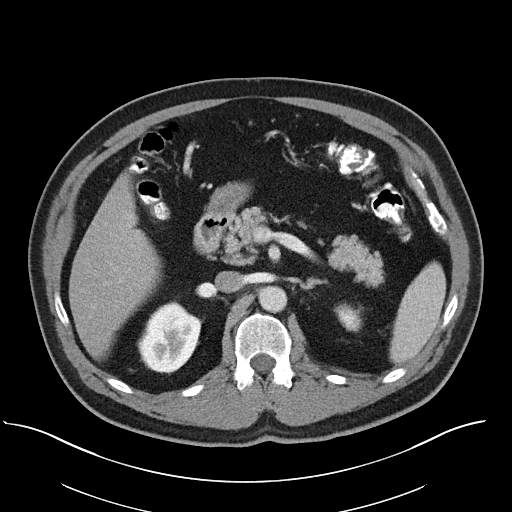
[im 77/107  bone]
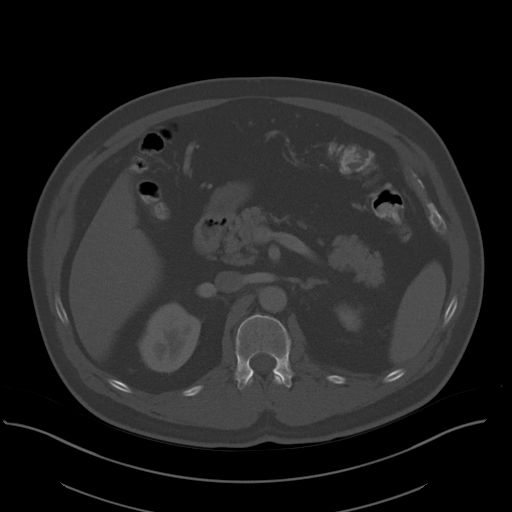
[im 83/107  soft-tissue]
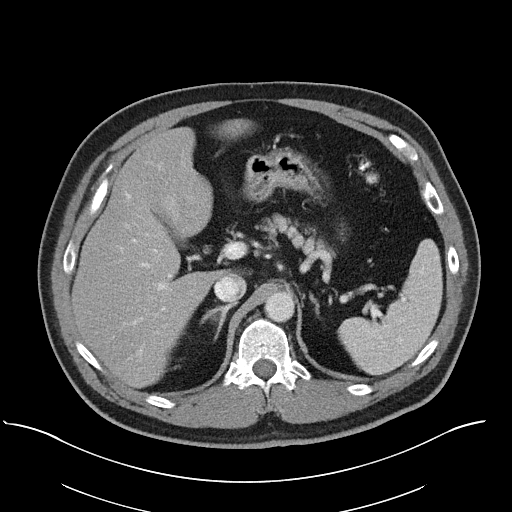
[im 89/107  soft-tissue]
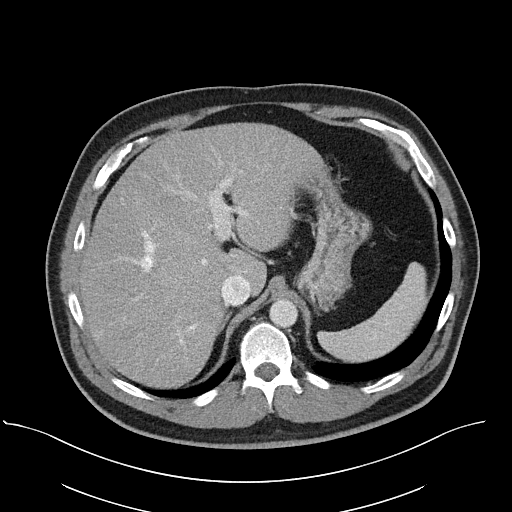
[im 101/107  soft-tissue]
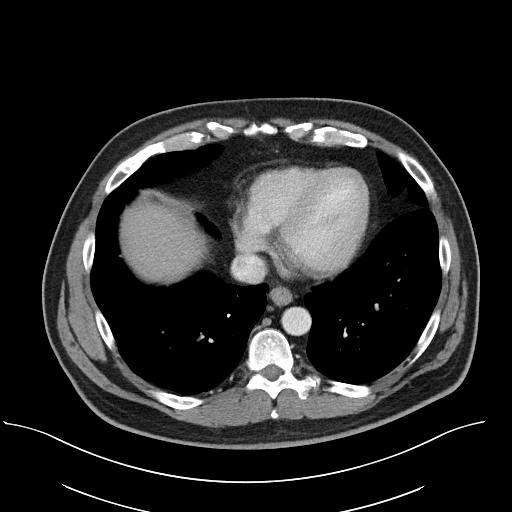

[Series 5: coronal st · coronal · 0.86mm/px · 3 of 105 slices shown]
[im 35/105  soft-tissue]
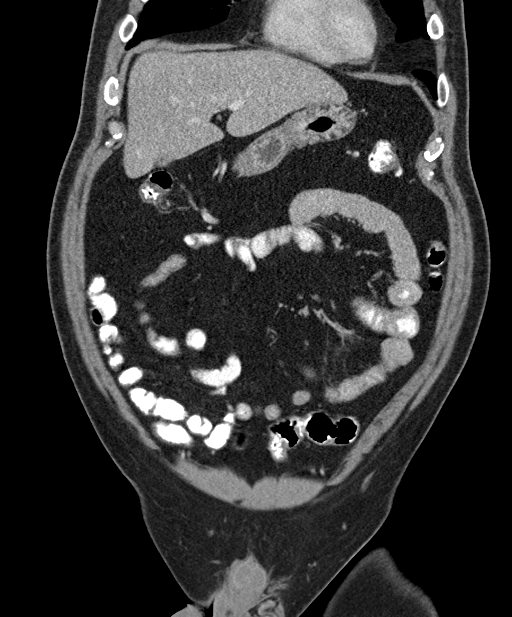
[im 47/105  soft-tissue]
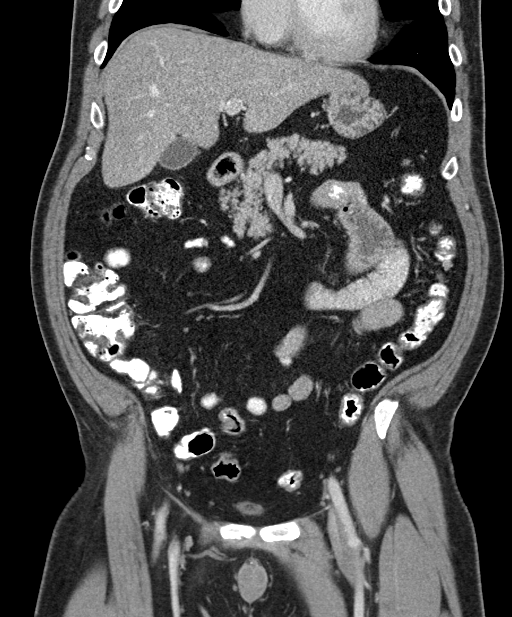
[im 58/105  soft-tissue]
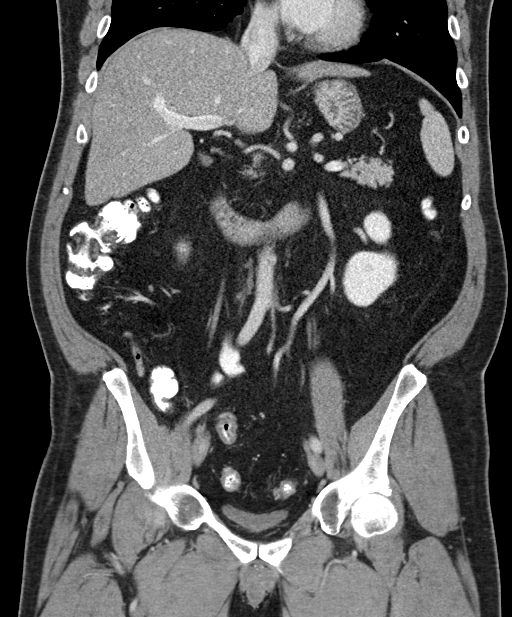

[15 of 46 positions shown; findings below may reference images not displayed]

RADIATION DOSE REDUCTION: This exam was performed according to the
departmental dose-optimization program which includes automated
exposure control, adjustment of the mA and/or kV according to
patient size and/or use of iterative reconstruction technique.

CONTRAST:  100mL OMNIPAQUE IOHEXOL 300 MG/ML  SOLN
FINDINGS: Lower chest: No acute abnormality.

Hepatobiliary: Diffuse hepatic hypoattenuation compatible with
hepatic steatosis. No focal hepatic abnormality or biliary
obstruction pattern. Gallbladder nondistended. Common bile duct
nondilated.

Pancreas: Unremarkable. No pancreatic ductal dilatation or
surrounding inflammatory changes.

Spleen: Normal in size without focal abnormality.

Adrenals/Urinary Tract: 2.7 cm hypodense well-circumscribed right
adrenal nodule.

Normal left adrenal gland.

No acute obstructive uropathy, hydronephrosis, focal renal
abnormality, associated hydroureter, or ureteral calculus. Bladder
unremarkable.

Stomach/Bowel: Stomach is within normal limits. Appendix appears
normal. No evidence of bowel wall thickening, distention, or
inflammatory changes.

Vascular/Lymphatic: Minor aortoiliac atherosclerosis without
aneurysm. Negative for dissection. No retroperitoneal hemorrhage or
hematoma. No adenopathy. No Debbie process. Mesenteric and
renal vasculature all appear patent.

Reproductive: No significant finding by CT.

Other: No abdominal wall hernia or abnormality. No abdominopelvic
ascites.

Musculoskeletal: Degenerative changes of the lower lumbar spine and
multilevel lumbar facet arthropathy. Postop changes from recent
lumbar spine surgery with a trace amount of lumbar region
subcutaneous edema and soft tissue air, image 62/2.
IMPRESSION: No acute intra-abdominopelvic finding by contrast CT. Negative for
obstructive uropathy, hydronephrosis, and nephrolithiasis.

Mild hepatic steatosis

Indeterminate 2.7 cm well-circumscribed right adrenal nodule favored
to be adenoma. No available comparison studies. Recommend follow-up
nonemergent abdominal MRI adrenal protocol for confirmation.

Postop changes from recent lower lumbar spinal surgery.

Aortic Atherosclerosis ([id number]).

## 2023-03-31 ENCOUNTER — Encounter: Payer: Self-pay | Admitting: Internal Medicine

## 2023-03-31 ENCOUNTER — Ambulatory Visit (INDEPENDENT_AMBULATORY_CARE_PROVIDER_SITE_OTHER): Payer: Self-pay | Admitting: Internal Medicine

## 2023-03-31 VITALS — BP 148/102 | HR 91 | Temp 98.3°F | Resp 16 | Ht 72.0 in | Wt 257.0 lb

## 2023-03-31 DIAGNOSIS — Z720 Tobacco use: Secondary | ICD-10-CM

## 2023-03-31 DIAGNOSIS — Z0001 Encounter for general adult medical examination with abnormal findings: Secondary | ICD-10-CM

## 2023-03-31 DIAGNOSIS — F331 Major depressive disorder, recurrent, moderate: Secondary | ICD-10-CM

## 2023-03-31 DIAGNOSIS — E66811 Obesity, class 1: Secondary | ICD-10-CM

## 2023-03-31 DIAGNOSIS — E7801 Familial hypercholesterolemia: Secondary | ICD-10-CM

## 2023-03-31 DIAGNOSIS — Z6834 Body mass index (BMI) 34.0-34.9, adult: Secondary | ICD-10-CM

## 2023-03-31 DIAGNOSIS — Z1211 Encounter for screening for malignant neoplasm of colon: Secondary | ICD-10-CM | POA: Insufficient documentation

## 2023-03-31 DIAGNOSIS — R0683 Snoring: Secondary | ICD-10-CM

## 2023-03-31 DIAGNOSIS — K760 Fatty (change of) liver, not elsewhere classified: Secondary | ICD-10-CM

## 2023-03-31 DIAGNOSIS — E6609 Other obesity due to excess calories: Secondary | ICD-10-CM

## 2023-03-31 DIAGNOSIS — D3501 Benign neoplasm of right adrenal gland: Secondary | ICD-10-CM

## 2023-03-31 DIAGNOSIS — I10 Essential (primary) hypertension: Secondary | ICD-10-CM

## 2023-03-31 LAB — HEPATIC FUNCTION PANEL
ALT: 137 U/L — ABNORMAL HIGH (ref 0–53)
AST: 52 U/L — ABNORMAL HIGH (ref 0–37)
Albumin: 4.5 g/dL (ref 3.5–5.2)
Alkaline Phosphatase: 60 U/L (ref 39–117)
Bilirubin, Direct: 0.1 mg/dL (ref 0.0–0.3)
Total Bilirubin: 0.5 mg/dL (ref 0.2–1.2)
Total Protein: 7.2 g/dL (ref 6.0–8.3)

## 2023-03-31 LAB — URINALYSIS, ROUTINE W REFLEX MICROSCOPIC
Hgb urine dipstick: NEGATIVE
Ketones, ur: NEGATIVE
Leukocytes,Ua: NEGATIVE
Nitrite: NEGATIVE
RBC / HPF: NONE SEEN (ref 0–?)
Specific Gravity, Urine: >=1.03 — AB (ref 1.000–1.030)
Total Protein, Urine: NEGATIVE
Urine Glucose: NEGATIVE
Urobilinogen, UA: 0.2 (ref 0.0–1.0)
pH: 6 (ref 5.0–8.0)

## 2023-03-31 LAB — CBC WITH DIFFERENTIAL/PLATELET
Basophils Absolute: 0.1 10*3/uL (ref 0.0–0.1)
Basophils Relative: 0.8 % (ref 0.0–3.0)
Eosinophils Absolute: 0.2 10*3/uL (ref 0.0–0.7)
Eosinophils Relative: 2.2 % (ref 0.0–5.0)
HCT: 53.4 % — ABNORMAL HIGH (ref 39.0–52.0)
Hemoglobin: 17.8 g/dL — ABNORMAL HIGH (ref 13.0–17.0)
Lymphocytes Relative: 39.6 % (ref 12.0–46.0)
Lymphs Abs: 2.9 10*3/uL (ref 0.7–4.0)
MCHC: 33.3 g/dL (ref 30.0–36.0)
MCV: 95.7 fl (ref 78.0–100.0)
Monocytes Absolute: 0.5 10*3/uL (ref 0.1–1.0)
Monocytes Relative: 7.2 % (ref 3.0–12.0)
Neutro Abs: 3.6 10*3/uL (ref 1.4–7.7)
Neutrophils Relative %: 50.2 % (ref 43.0–77.0)
Platelets: 202 10*3/uL (ref 150.0–400.0)
RBC: 5.58 Mil/uL (ref 4.22–5.81)
RDW: 14.3 % (ref 11.5–15.5)
WBC: 7.2 10*3/uL (ref 4.0–10.5)

## 2023-03-31 LAB — LDL CHOLESTEROL, DIRECT: Direct LDL: 204 mg/dL

## 2023-03-31 LAB — BASIC METABOLIC PANEL
BUN: 11 mg/dL (ref 6–23)
CO2: 26 mEq/L (ref 19–32)
Calcium: 9.7 mg/dL (ref 8.4–10.5)
Chloride: 104 mEq/L (ref 96–112)
Creatinine, Ser: 0.91 mg/dL (ref 0.40–1.50)
GFR: 101.74 mL/min (ref >60.00)
Glucose, Bld: 108 mg/dL — ABNORMAL HIGH (ref 70–99)
Potassium: 4 mEq/L (ref 3.5–5.1)
Sodium: 137 mEq/L (ref 135–145)

## 2023-03-31 LAB — LIPID PANEL
Cholesterol: 285 mg/dL — ABNORMAL HIGH (ref 0–200)
HDL: 38.8 mg/dL — ABNORMAL LOW (ref >39.00)
NonHDL: 246.04
Total CHOL/HDL Ratio: 7
Triglycerides: 248 mg/dL — ABNORMAL HIGH (ref 0.0–149.0)
VLDL: 49.6 mg/dL — ABNORMAL HIGH (ref 0.0–40.0)

## 2023-03-31 LAB — TSH: TSH: 1.87 u[IU]/mL (ref 0.35–5.50)

## 2023-03-31 LAB — HEMOGLOBIN A1C: Hgb A1c MFr Bld: 5.7 % (ref 4.6–6.5)

## 2023-03-31 MED ORDER — AZILSARTAN-CHLORTHALIDONE 40-12.5 MG PO TABS
1.0000 | ORAL_TABLET | Freq: Every day | ORAL | 0 refills | Status: DC
Start: 2023-03-31 — End: 2023-06-15

## 2023-03-31 MED ORDER — ZEPBOUND 2.5 MG/0.5ML ~~LOC~~ SOAJ
2.5000 mg | SUBCUTANEOUS | 0 refills | Status: DC
Start: 2023-03-31 — End: 2023-11-08

## 2023-03-31 MED ORDER — ROSUVASTATIN CALCIUM 20 MG PO TABS
ORAL_TABLET | ORAL | 0 refills | Status: DC
Start: 2023-03-31 — End: 2023-06-25

## 2023-03-31 NOTE — Progress Notes (Signed)
Subjective:  Patient ID: Anthony Nielsen, male    DOB: 1975-12-20  Age: 47 y.o. MRN: [id number]  CC: Annual Exam, Hypertension, and Hyperlipidemia   HPI Anthony Nielsen presents for a CPX and f/up -  He walks 1-1/2 miles every other day.  His endurance is good.  He denies chest pain, shortness of breath, diaphoresis, or edema.  Outpatient Medications Prior to Visit  Medication Sig Dispense Refill   DULoxetine (CYMBALTA) 60 MG capsule TAKE 1 CAPSULE(60 MG) BY MOUTH DAILY 90 capsule 1   QUEtiapine (SEROQUEL) 50 MG tablet Take 2 tablets (100 mg total) by mouth at bedtime. Annual appt is due in April must see provider for future refills 60 tablet 0   rosuvastatin (CRESTOR) 20 MG tablet TAKE 1 TABLET(20 MG) BY MOUTH DAILY 90 tablet 1   No facility-administered medications prior to visit.    ROS Review of Systems  Constitutional:  Positive for unexpected weight change (wt gain). Negative for chills, diaphoresis and fatigue.  HENT: Negative.    Eyes: Negative.  Negative for visual disturbance.  Respiratory:  Negative for apnea, cough, chest tightness, shortness of breath and wheezing.        Snoring  Cardiovascular:  Negative for chest pain, palpitations and leg swelling.  Gastrointestinal:  Negative for abdominal pain, blood in stool, constipation, diarrhea, nausea and vomiting.  Endocrine: Negative.   Genitourinary: Negative.  Negative for difficulty urinating, penile swelling and scrotal swelling.  Musculoskeletal:  Negative for arthralgias, joint swelling and myalgias.  Skin: Negative.  Negative for color change, pallor and rash.  Neurological:  Negative for dizziness, weakness and light-headedness.  Hematological:  Negative for adenopathy. Does not bruise/bleed easily.  Psychiatric/Behavioral:  Positive for dysphoric mood. Negative for behavioral problems, confusion, sleep disturbance and suicidal ideas. The patient is not nervous/anxious.     Objective:  BP (!) 148/102  Comment: right  Pulse 91   Temp 98.3 F (36.8 C) (Oral)   Resp 16   Ht 6' (1.829 m)   Wt 257 lb (116.6 kg)   SpO2 98%   BMI 34.86 kg/m   BP Readings from Last 3 Encounters:  03/31/23 (!) 148/102  03/01/22 124/84  01/13/22 (!) 143/97    Wt Readings from Last 3 Encounters:  03/31/23 257 lb (116.6 kg)  03/01/22 238 lb (108 kg)  01/13/22 251 lb 5.2 oz (114 kg)    Physical Exam Vitals reviewed.  Constitutional:      Appearance: He is obese. He is not ill-appearing.  HENT:     Nose: Nose normal.     Mouth/Throat:     Mouth: Mucous membranes are moist.  Eyes:     General: No scleral icterus.    Conjunctiva/sclera: Conjunctivae normal.  Cardiovascular:     Rate and Rhythm: Normal rate and regular rhythm.     Heart sounds: Normal heart sounds, S1 normal and S2 normal. No murmur heard.    Comments: EKG- NSR, 82 bpm Low voltage in V1 and V2 is old No LVH or acute ST/T wave changes Pulmonary:     Effort: Pulmonary effort is normal.     Breath sounds: No stridor. No wheezing, rhonchi or rales.  Abdominal:     General: Abdomen is protuberant. There is no distension.     Palpations: There is no hepatomegaly, splenomegaly or mass.     Tenderness: There is no abdominal tenderness. There is no guarding.     Hernia: No hernia is present.  Musculoskeletal:  Cervical back: Neck supple.     Right lower leg: No edema.     Left lower leg: No edema.  Skin:    General: Skin is warm and dry.  Neurological:     General: No focal deficit present.     Mental Status: Mental status is at baseline.  Psychiatric:        Mood and Affect: Mood normal.        Behavior: Behavior normal.     Lab Results  Component Value Date   WBC 7.2 03/31/2023   HGB 17.8 (H) 03/31/2023   HCT 53.4 (H) 03/31/2023   PLT 202.0 03/31/2023   GLUCOSE 108 (H) 03/31/2023   CHOL 285 (H) 03/31/2023   TRIG 248.0 (H) 03/31/2023   HDL 38.80 (L) 03/31/2023   LDLDIRECT 204.0 03/31/2023   LDLCALC 119 (H)  05/08/2022   ALT 137 (H) 03/31/2023   AST 52 (H) 03/31/2023   NA 137 03/31/2023   K 4.0 03/31/2023   CL 104 03/31/2023   CREATININE 0.91 03/31/2023   BUN 11 03/31/2023   CO2 26 03/31/2023   TSH 1.87 03/31/2023   PSA 0.16 01/01/2022   INR 1.0 01/13/2022   HGBA1C 5.7 03/31/2023    No results found.  Assessment & Plan:  Adenoma of right adrenal gland- I will evaluate for hyperaldosteronism. -     Aldosterone + renin activity w/ ratio; Future  Essential familial hypercholesterolemia- I have asked him to take a statin for cardiovascular risk reduction. -     Lipid panel; Future -     TSH; Future -     Rosuvastatin Calcium; TAKE 1 TABLET(20 MG) BY MOUTH DAILY  Dispense: 90 tablet; Refill: 0  Encounter for general adult medical examination with abnormal findings- Exam completed, labs reviewed, vaccines reviewed, cancer screenings addressed, patient education was given.  Tobacco abuse  Snoring -     Ambulatory referral to Sleep Studies  NAFLD (nonalcoholic fatty liver disease) -     Hepatic function panel; Future -     Zepbound; Inject 2.5 mg into the skin once a week.  Dispense: 4 mL; Refill: 0  Moderate episode of recurrent major depressive disorder (HCC)- This is being treated by someone else.       -     TSH; Future  Screening for colon cancer -     Cologuard  Primary hypertension- His blood pressure is not adequately well-controlled.  Will treat with a combination of an ARB and thiazide diuretic. -     EKG 12-Lead -     Basic metabolic panel; Future -     CBC with Differential/Platelet; Future -     Aldosterone + renin activity w/ ratio; Future -     TSH; Future -     Urinalysis, Routine w reflex microscopic; Future -     Azilsartan-Chlorthalidone; Take 1 tablet by mouth daily.  Dispense: 90 tablet; Refill: 0  Class 1 obesity due to excess calories with serious comorbidity and body mass index (BMI) of 34.0 to 34.9 in adult -     TSH; Future -     Hepatic function  panel; Future -     Hemoglobin A1c; Future -     Zepbound; Inject 2.5 mg into the skin once a week.  Dispense: 4 mL; Refill: 0  Other orders -     LDL cholesterol, direct     Follow-up: Return in about 3 months (around 07/01/2023).  Sanda Linger, MD

## 2023-03-31 NOTE — Patient Instructions (Signed)
Hypertension, Adult High blood pressure (hypertension) is when the force of blood pumping through the arteries is too strong. The arteries are the blood vessels that carry blood from the heart throughout the body. Hypertension forces the heart to work harder to pump blood and may cause arteries to become narrow or stiff. Untreated or uncontrolled hypertension can lead to a heart attack, heart failure, a stroke, kidney disease, and other problems. A blood pressure reading consists of a higher number over a lower number. Ideally, your blood pressure should be below 120/80. The first ("top") number is called the systolic pressure. It is a measure of the pressure in your arteries as your heart beats. The second ("bottom") number is called the diastolic pressure. It is a measure of the pressure in your arteries as the heart relaxes. What are the causes? The exact cause of this condition is not known. There are some conditions that result in high blood pressure. What increases the risk? Certain factors may make you more likely to develop high blood pressure. Some of these risk factors are under your control, including: Smoking. Not getting enough exercise or physical activity. Being overweight. Having too much fat, sugar, calories, or salt (sodium) in your diet. Drinking too much alcohol. Other risk factors include: Having a personal history of heart disease, diabetes, high cholesterol, or kidney disease. Stress. Having a family history of high blood pressure and high cholesterol. Having obstructive sleep apnea. Age. The risk increases with age. What are the signs or symptoms? High blood pressure may not cause symptoms. Very high blood pressure (hypertensive crisis) may cause: Headache. Fast or irregular heartbeats (palpitations). Shortness of breath. Nosebleed. Nausea and vomiting. Vision changes. Severe chest pain, dizziness, and seizures. How is this diagnosed? This condition is diagnosed by  measuring your blood pressure while you are seated, with your arm resting on a flat surface, your legs uncrossed, and your feet flat on the floor. The cuff of the blood pressure monitor will be placed directly against the skin of your upper arm at the level of your heart. Blood pressure should be measured at least twice using the same arm. Certain conditions can cause a difference in blood pressure between your right and left arms. If you have a high blood pressure reading during one visit or you have normal blood pressure with other risk factors, you may be asked to: Return on a different day to have your blood pressure checked again. Monitor your blood pressure at home for 1 week or longer. If you are diagnosed with hypertension, you may have other blood or imaging tests to help your health care provider understand your overall risk for other conditions. How is this treated? This condition is treated by making healthy lifestyle changes, such as eating healthy foods, exercising more, and reducing your alcohol intake. You may be referred for counseling on a healthy diet and physical activity. Your health care provider may prescribe medicine if lifestyle changes are not enough to get your blood pressure under control and if: Your systolic blood pressure is above 130. Your diastolic blood pressure is above 80. Your personal target blood pressure may vary depending on your medical conditions, your age, and other factors. Follow these instructions at home: Eating and drinking  Eat a diet that is high in fiber and potassium, and low in sodium, added sugar, and fat. An example of this eating plan is called the DASH diet. DASH stands for Dietary Approaches to Stop Hypertension. To eat this way: Eat   plenty of fresh fruits and vegetables. Try to fill one half of your plate at each meal with fruits and vegetables. Eat whole grains, such as whole-wheat pasta, brown rice, or whole-grain bread. Fill about one  fourth of your plate with whole grains. Eat or drink low-fat dairy products, such as skim milk or low-fat yogurt. Avoid fatty cuts of meat, processed or cured meats, and poultry with skin. Fill about one fourth of your plate with lean proteins, such as fish, chicken without skin, beans, eggs, or tofu. Avoid pre-made and processed foods. These tend to be higher in sodium, added sugar, and fat. Reduce your daily sodium intake. Many people with hypertension should eat less than 1,500 mg of sodium a day. Do not drink alcohol if: Your health care provider tells you not to drink. You are pregnant, may be pregnant, or are planning to become pregnant. If you drink alcohol: Limit how much you have to: 0-1 drink a day for women. 0-2 drinks a day for men. Know how much alcohol is in your drink. In the [place] one drink equals one 12 oz bottle of beer (355 mL), one 5 oz glass of wine (148 mL), or one 1 oz glass of hard liquor (44 mL). Lifestyle  Work with your health care provider to maintain a healthy body weight or to lose weight. Ask what an ideal weight is for you. Get at least 30 minutes of exercise that causes your heart to beat faster (aerobic exercise) most days of the week. Activities may include walking, swimming, or biking. Include exercise to strengthen your muscles (resistance exercise), such as Pilates or lifting weights, as part of your weekly exercise routine. Try to do these types of exercises for 30 minutes at least 3 days a week. Do not use any products that contain nicotine or tobacco. These products include cigarettes, chewing tobacco, and vaping devices, such as e-cigarettes. If you need help quitting, ask your health care provider. Monitor your blood pressure at home as told by your health care provider. Keep all follow-up visits. This is important. Medicines Take over-the-counter and prescription medicines only as told by your health care provider. Follow directions carefully. Blood  pressure medicines must be taken as prescribed. Do not skip doses of blood pressure medicine. Doing this puts you at risk for problems and can make the medicine less effective. Ask your health care provider about side effects or reactions to medicines that you should watch for. Contact a health care provider if you: Think you are having a reaction to a medicine you are taking. Have headaches that keep coming back (recurring). Feel dizzy. Have swelling in your ankles. Have trouble with your vision. Get help right away if you: Develop a severe headache or confusion. Have unusual weakness or numbness. Feel faint. Have severe pain in your chest or abdomen. Vomit repeatedly. Have trouble breathing. These symptoms may be an emergency. Get help right away. Call 911. Do not wait to see if the symptoms will go away. Do not drive yourself to the hospital. Summary Hypertension is when the force of blood pumping through your arteries is too strong. If this condition is not controlled, it may put you at risk for serious complications. Your personal target blood pressure may vary depending on your medical conditions, your age, and other factors. For most people, a normal blood pressure is less than 120/80. Hypertension is treated with lifestyle changes, medicines, or a combination of both. Lifestyle changes include losing weight, eating a healthy,   low-sodium diet, exercising more, and limiting alcohol. This information is not intended to replace advice given to you by your health care provider. Make sure you discuss any questions you have with your health care provider. Document Revised: 09/01/2021 Document Reviewed: 09/01/2021 Elsevier Patient Education  2023 [employer]  

## 2023-04-08 LAB — ALDOSTERONE + RENIN ACTIVITY W/ RATIO
ALDO / PRA Ratio: 4.5 Ratio (ref 0.9–28.9)
Aldosterone: 10 ng/dL
Renin Activity: 2.21 ng/mL/h (ref 0.25–5.82)

## 2023-04-20 IMAGING — MR MR ABDOMEN WO/W CM
16 series · 48 of 48 positions shown · IV contrast (multihance)
Comparison: CT abdomen and pelvis 01/21/2022

CLINICAL DATA: Adrenal nodule

EXAM:
MRI ABDOMEN WITHOUT AND WITH CONTRAST
TECHNIQUE: Multiplanar multisequence MR imaging of the abdomen was performed
both before and after the administration of intravenous contrast.
CONTRAST:  20mL MULTIHANCE GADOBENATE DIMEGLUMINE 529 MG/ML IV SOLN

[Series 2: T2 · coronal · 5.0mm · 1.56mm/px · 1 of 37 slices shown (1 of 3)]
[im 1/37]
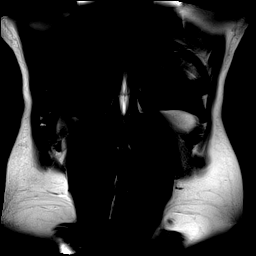

[Series 3: T1 · axial · 3.0mm · 1.19mm/px · z∈[-132,+105]mm · 6 of 160 slices shown]
[im 1/160]
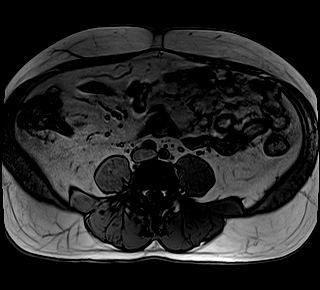
[im 32/160]
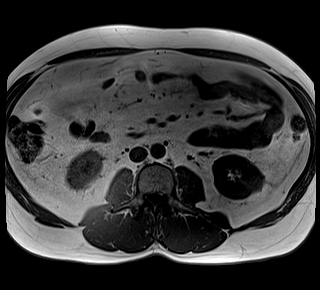
[im 64/160]
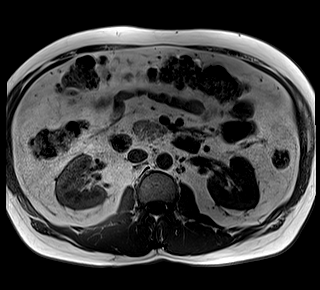
[im 96/160]
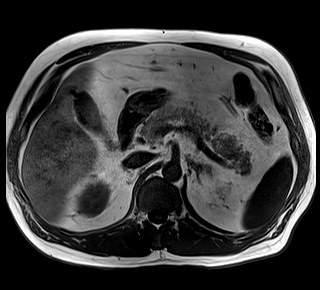
[im 128/160]
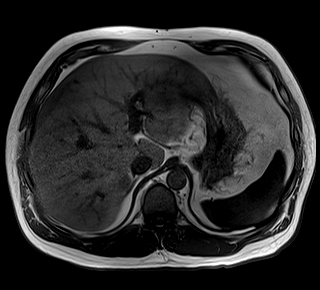
[im 160/160]
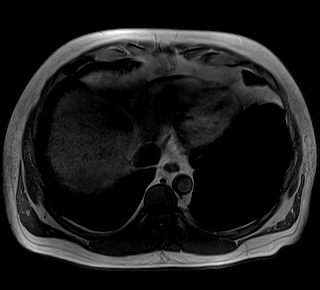

[Series 4: cor inout phase · coronal · 3.0mm · 1.19mm/px · 5 of 144 slices shown]
[im 1/144]
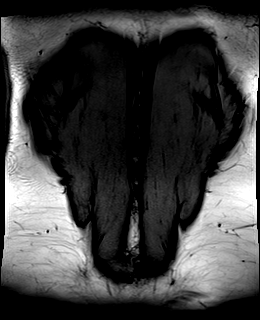
[im 36/144]
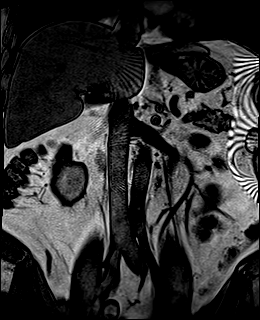
[im 72/144]
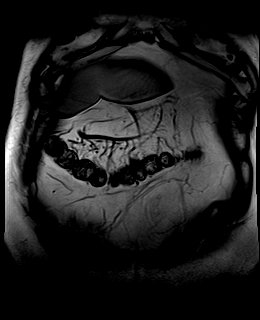
[im 108/144]
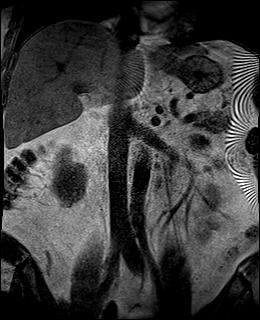
[im 144/144]
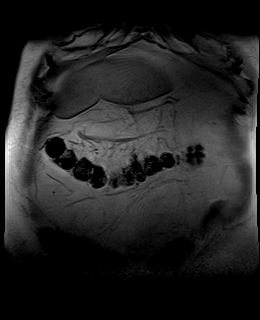

[Series 5: T2 · axial · 5.0mm · 1.48mm/px · z∈[-149,+121]mm · 2 of 46 slices shown (2 of 3)]
[im 1/46]
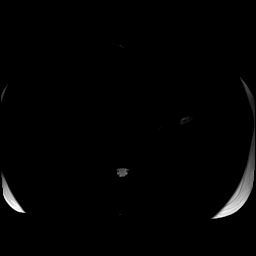
[im 46/46]
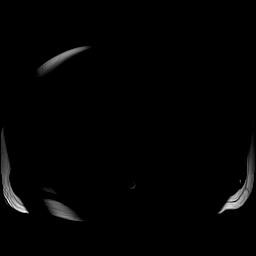

[Series 6: DWI · axial · 5.0mm · 1.42mm/px · z∈[-140,+112]mm · 5 of 129 slices shown (1 of 2)]
[im 1/129]
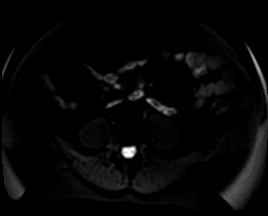
[im 33/129]
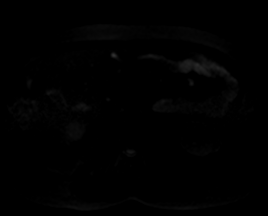
[im 65/129]
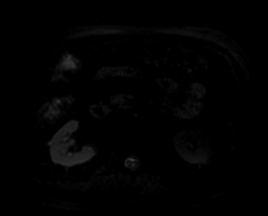
[im 97/129]
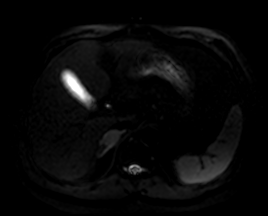
[im 129/129]
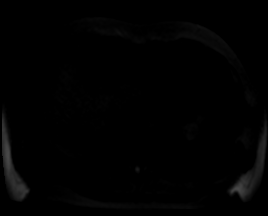

[Series 7: DWI · axial · 5.0mm · 1.42mm/px · z∈[-140,+112]mm · 2 of 43 slices shown (2 of 2)]
[im 1/43]
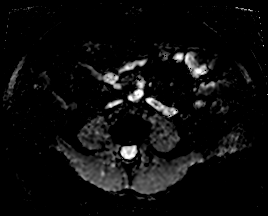
[im 43/43]
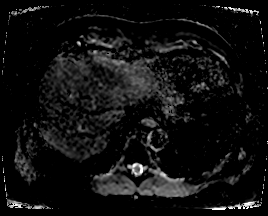

[Series 8: T2 · axial · 6.0mm · 1.22mm/px · 1 of 37 slices shown (3 of 3)]
[im 1/37]
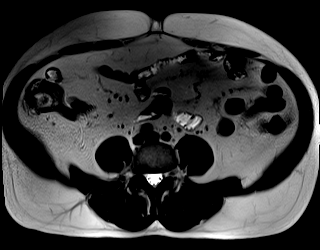

[Series 9: bSSFP · axial · 5.0mm · 1.25mm/px · z∈[-143,+115]mm · 2 of 44 slices shown]
[im 1/44]
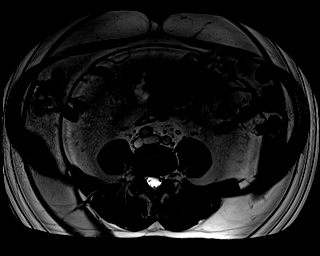
[im 44/44]
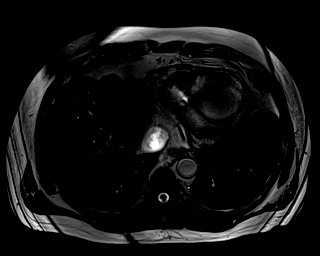

[Series 10: T1 dynamic · axial · non-contrast · 3.0mm · 1.25mm/px · z∈[-144,+117]mm · 3 of 88 slices shown]
[im 1/88]
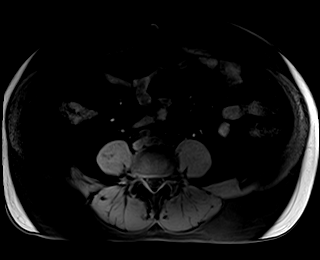
[im 44/88]
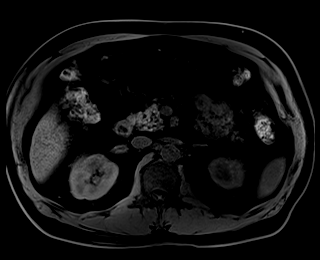
[im 88/88]
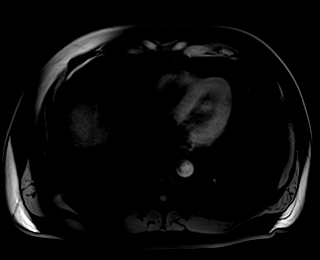

[Series 11: T1 dynamic post-contrast · axial · 3.0mm · 1.25mm/px · z∈[-144,+117]mm · 3 of 88 slices shown (1 of 7)]
[im 1/88]
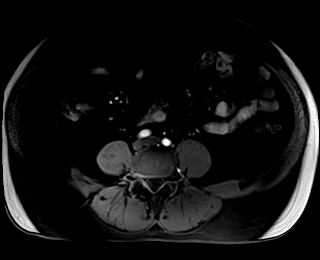
[im 44/88]
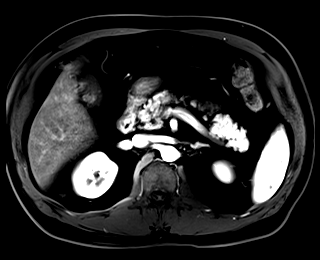
[im 88/88]
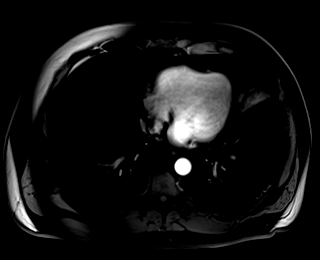

[Series 12: T1 dynamic post-contrast · axial · 3.0mm · 1.25mm/px · z∈[-144,+117]mm · 3 of 88 slices shown (2 of 7)]
[im 1/88]
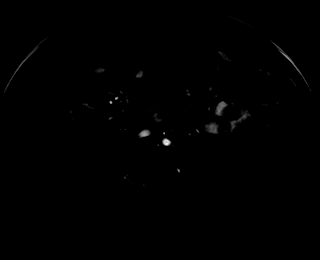
[im 44/88]
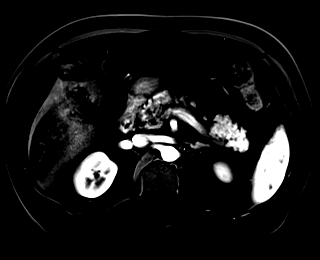
[im 88/88]
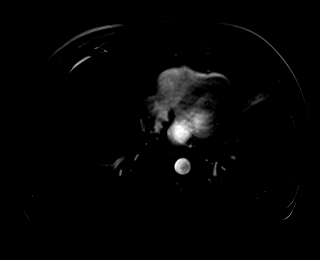

[Series 13: T1 dynamic post-contrast · axial · 3.0mm · 1.25mm/px · z∈[-144,+117]mm · 3 of 88 slices shown (3 of 7)]
[im 1/88]
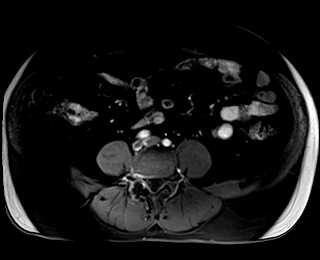
[im 44/88]
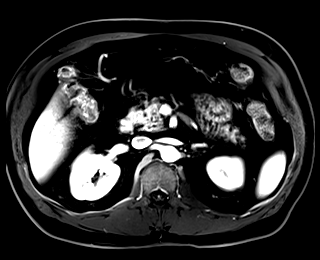
[im 88/88]
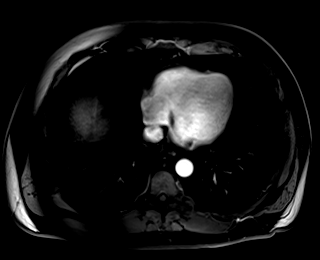

[Series 14: T1 dynamic post-contrast · axial · 3.0mm · 1.25mm/px · z∈[-144,+117]mm · 3 of 88 slices shown (4 of 7)]
[im 1/88]
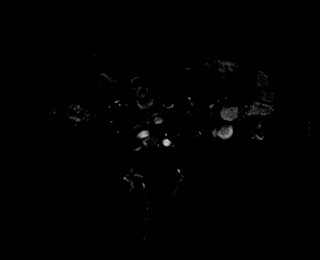
[im 44/88]
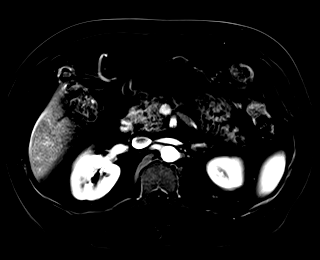
[im 88/88]
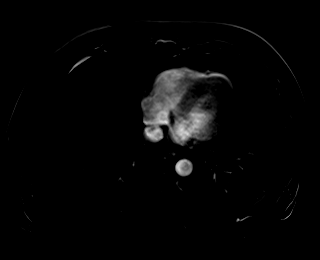

[Series 15: T1 dynamic post-contrast · axial · 3.0mm · 1.25mm/px · z∈[-144,+117]mm · 3 of 88 slices shown (5 of 7)]
[im 1/88]
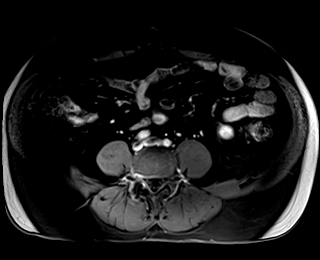
[im 44/88]
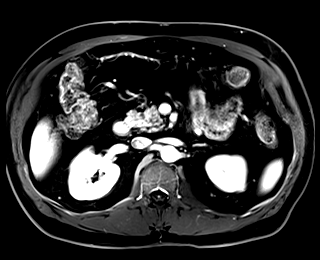
[im 88/88]
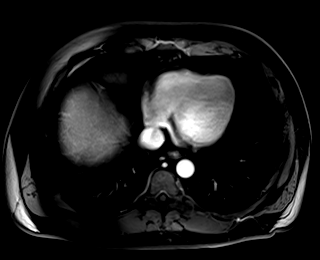

[Series 16: T1 dynamic post-contrast · axial · 3.0mm · 1.25mm/px · z∈[-144,+117]mm · 3 of 88 slices shown (6 of 7)]
[im 1/88]
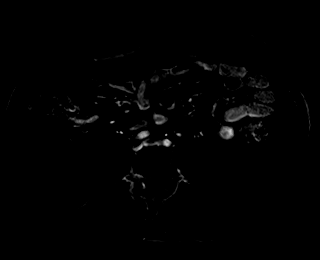
[im 44/88]
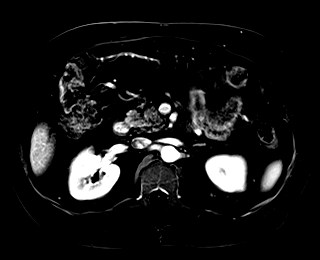
[im 88/88]
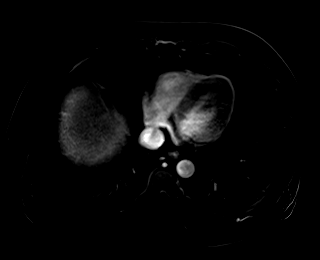

[Series 17: T1 dynamic post-contrast · coronal · 3.0mm · 1.25mm/px · 3 of 80 slices shown (7 of 7)]
[im 1/80]
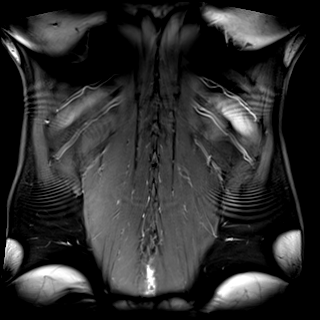
[im 40/80]
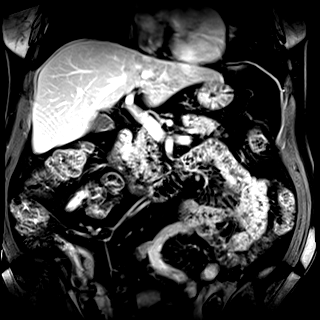
[im 80/80]
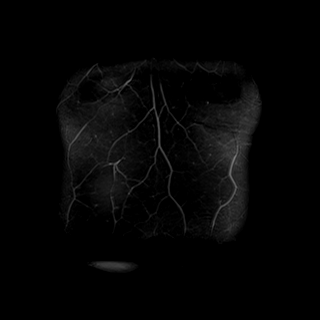

[48 of 48 positions shown; findings below may reference images not displayed]

FINDINGS: Lower chest: No acute findings.

Hepatobiliary: Liver is normal in size and contour with no
suspicious mass identified. There is evidence of hepatic steatosis.
Gallbladder appears normal. No biliary ductal dilatation identified.

Pancreas: No mass, inflammatory changes, or other parenchymal
abnormality identified.

Spleen:  Within normal limits in size and appearance.

Adrenals/Urinary Tract: 2.4 cm right adrenal gland nodule with out
of phase signal dropout, consistent with adenoma. Left adrenal gland
is normal. Kidneys appear normal.

Stomach/Bowel: Visualized portions within the abdomen are
unremarkable.

Vascular/Lymphatic: No pathologically enlarged lymph nodes
identified. No abdominal aortic aneurysm demonstrated.

Other:  No ascites.

Musculoskeletal: No suspicious bone lesions identified.
IMPRESSION: 1. Right adrenal gland nodule consistent with adenoma.
2. Hepatic steatosis.

## 2023-06-15 ENCOUNTER — Other Ambulatory Visit: Payer: Self-pay | Admitting: Internal Medicine

## 2023-06-15 DIAGNOSIS — I10 Essential (primary) hypertension: Secondary | ICD-10-CM

## 2023-06-25 ENCOUNTER — Other Ambulatory Visit: Payer: Self-pay | Admitting: Internal Medicine

## 2023-06-25 DIAGNOSIS — E7801 Familial hypercholesterolemia: Secondary | ICD-10-CM

## 2023-09-04 ENCOUNTER — Other Ambulatory Visit: Payer: Self-pay | Admitting: Internal Medicine

## 2023-09-04 DIAGNOSIS — I10 Essential (primary) hypertension: Secondary | ICD-10-CM

## 2023-09-23 ENCOUNTER — Other Ambulatory Visit: Payer: Self-pay | Admitting: Internal Medicine

## 2023-09-23 DIAGNOSIS — E7801 Familial hypercholesterolemia: Secondary | ICD-10-CM

## 2023-10-28 ENCOUNTER — Other Ambulatory Visit: Payer: Self-pay | Admitting: Internal Medicine

## 2023-10-28 DIAGNOSIS — F331 Major depressive disorder, recurrent, moderate: Secondary | ICD-10-CM

## 2023-10-28 DIAGNOSIS — K760 Fatty (change of) liver, not elsewhere classified: Secondary | ICD-10-CM

## 2023-10-28 DIAGNOSIS — I10 Essential (primary) hypertension: Secondary | ICD-10-CM

## 2023-10-28 DIAGNOSIS — E6609 Other obesity due to excess calories: Secondary | ICD-10-CM

## 2023-10-28 DIAGNOSIS — E7801 Familial hypercholesterolemia: Secondary | ICD-10-CM

## 2023-11-08 ENCOUNTER — Encounter: Payer: Self-pay | Admitting: Gastroenterology

## 2023-11-08 ENCOUNTER — Other Ambulatory Visit: Payer: Self-pay | Admitting: Internal Medicine

## 2023-11-08 ENCOUNTER — Ambulatory Visit: Payer: Medicaid Other | Admitting: Internal Medicine

## 2023-11-08 VITALS — BP 156/98 | HR 94 | Temp 98.3°F | Resp 16 | Ht 72.0 in | Wt 273.0 lb

## 2023-11-08 DIAGNOSIS — R0683 Snoring: Secondary | ICD-10-CM

## 2023-11-08 DIAGNOSIS — E669 Obesity, unspecified: Secondary | ICD-10-CM | POA: Diagnosis not present

## 2023-11-08 DIAGNOSIS — K760 Fatty (change of) liver, not elsewhere classified: Secondary | ICD-10-CM | POA: Diagnosis not present

## 2023-11-08 DIAGNOSIS — D3501 Benign neoplasm of right adrenal gland: Secondary | ICD-10-CM

## 2023-11-08 DIAGNOSIS — E6609 Other obesity due to excess calories: Secondary | ICD-10-CM

## 2023-11-08 DIAGNOSIS — Z720 Tobacco use: Secondary | ICD-10-CM

## 2023-11-08 DIAGNOSIS — E7801 Familial hypercholesterolemia: Secondary | ICD-10-CM

## 2023-11-08 DIAGNOSIS — I10 Essential (primary) hypertension: Secondary | ICD-10-CM

## 2023-11-08 DIAGNOSIS — F331 Major depressive disorder, recurrent, moderate: Secondary | ICD-10-CM

## 2023-11-08 DIAGNOSIS — Z1211 Encounter for screening for malignant neoplasm of colon: Secondary | ICD-10-CM

## 2023-11-08 DIAGNOSIS — E781 Pure hyperglyceridemia: Secondary | ICD-10-CM

## 2023-11-08 LAB — BASIC METABOLIC PANEL
BUN: 13 mg/dL (ref 6–23)
CO2: 25 meq/L (ref 19–32)
Calcium: 10.1 mg/dL (ref 8.4–10.5)
Chloride: 103 meq/L (ref 96–112)
Creatinine, Ser: 0.94 mg/dL (ref 0.40–1.50)
GFR: 97.44 mL/min (ref >60.00)
Glucose, Bld: 108 mg/dL — ABNORMAL HIGH (ref 70–99)
Potassium: 4 meq/L (ref 3.5–5.1)
Sodium: 138 meq/L (ref 135–145)

## 2023-11-08 LAB — URINALYSIS, ROUTINE W REFLEX MICROSCOPIC
Hgb urine dipstick: NEGATIVE
Ketones, ur: NEGATIVE
Leukocytes,Ua: NEGATIVE
Nitrite: NEGATIVE
Specific Gravity, Urine: >=1.03 — AB (ref 1.000–1.030)
Urine Glucose: NEGATIVE
Urobilinogen, UA: 0.2 (ref 0.0–1.0)
pH: 6 (ref 5.0–8.0)

## 2023-11-08 LAB — PROTIME-INR
INR: 1 {ratio} (ref 0.8–1.0)
Prothrombin Time: 10.2 s (ref 9.6–13.1)

## 2023-11-08 LAB — LIPID PANEL
Cholesterol: 319 mg/dL — ABNORMAL HIGH (ref 0–200)
HDL: 37.7 mg/dL — ABNORMAL LOW (ref >39.00)
Total CHOL/HDL Ratio: 8
Triglycerides: 488 mg/dL — ABNORMAL HIGH (ref 0.0–149.0)

## 2023-11-08 LAB — HEPATIC FUNCTION PANEL
ALT: 85 U/L — ABNORMAL HIGH (ref 0–53)
AST: 46 U/L — ABNORMAL HIGH (ref 0–37)
Albumin: 4.7 g/dL (ref 3.5–5.2)
Alkaline Phosphatase: 60 U/L (ref 39–117)
Bilirubin, Direct: 0.1 mg/dL (ref 0.0–0.3)
Total Bilirubin: 0.4 mg/dL (ref 0.2–1.2)
Total Protein: 7.3 g/dL (ref 6.0–8.3)

## 2023-11-08 LAB — CBC WITH DIFFERENTIAL/PLATELET
Basophils Absolute: 0.1 10*3/uL (ref 0.0–0.1)
Basophils Relative: 0.9 % (ref 0.0–3.0)
Eosinophils Absolute: 0.2 10*3/uL (ref 0.0–0.7)
Eosinophils Relative: 2.9 % (ref 0.0–5.0)
HCT: 50.9 % (ref 39.0–52.0)
Hemoglobin: 17.2 g/dL — ABNORMAL HIGH (ref 13.0–17.0)
Lymphocytes Relative: 41.8 % (ref 12.0–46.0)
Lymphs Abs: 3.1 10*3/uL (ref 0.7–4.0)
MCHC: 33.8 g/dL (ref 30.0–36.0)
MCV: 96 fL (ref 78.0–100.0)
Monocytes Absolute: 0.6 10*3/uL (ref 0.1–1.0)
Monocytes Relative: 7.6 % (ref 3.0–12.0)
Neutro Abs: 3.5 10*3/uL (ref 1.4–7.7)
Neutrophils Relative %: 46.8 % (ref 43.0–77.0)
Platelets: 204 10*3/uL (ref 150.0–400.0)
RBC: 5.31 Mil/uL (ref 4.22–5.81)
RDW: 13.5 % (ref 11.5–15.5)
WBC: 7.5 10*3/uL (ref 4.0–10.5)

## 2023-11-08 LAB — TSH: TSH: 2.5 u[IU]/mL (ref 0.35–5.50)

## 2023-11-08 LAB — HEMOGLOBIN A1C: Hgb A1c MFr Bld: 6.4 % (ref 4.6–6.5)

## 2023-11-08 LAB — LDL CHOLESTEROL, DIRECT: Direct LDL: 190 mg/dL

## 2023-11-08 MED ORDER — ZEPBOUND 2.5 MG/0.5ML ~~LOC~~ SOAJ: 2.5000 mg | SUBCUTANEOUS | 0 refills | Status: DC

## 2023-11-08 MED ORDER — ROSUVASTATIN CALCIUM 20 MG PO TABS: ORAL_TABLET | ORAL | 0 refills | Status: DC

## 2023-11-08 MED ORDER — EDARBYCLOR 40-12.5 MG PO TABS: 1.0000 | ORAL_TABLET | Freq: Every day | ORAL | 0 refills | Status: DC

## 2023-11-08 NOTE — Progress Notes (Signed)
 Subjective:  Patient ID: Anthony Nielsen, male    DOB: 06/27/1976  Age: 47 y.o. MRN: [id number]  CC: Hypertension   HPI Anthony Nielsen presents for f/up ----  Discussed the use of AI scribe software for clinical note transcription with the patient, who gave verbal consent to proceed.  History of Present Illness   The patient presents with a chief complaint of weight gain over the holiday period and a lapse in medication for hypertension and hyperlipidemia due to running out of prescriptions about a month ago. He reports a transient increase in headaches after discontinuing the blood pressure medication, but denies any current headaches or blurred vision. He also denies any chest pain or shortness of breath.  The patient has been off rosuvastatin  and Edarbychlor, a blood pressure medication, for about a month. He also reports discontinuation of Zepbound  weekly injections and Seroquel , with no current use of Cymbalta  or any other antidepressants. He does not feel the need for an antidepressant, as the blood pressure medication helped with sleep and maintained his blood pressure at a reasonable level.  The patient admits to smoking about a third of a pack of cigarettes daily and consuming alcohol a couple of times a month.   The patient has been inactive for the past couple of weeks but enjoys walking around the neighborhood, when possible, with no reported chest pain or shortness of breath during these walks. He attributes his recent weight gain to increased eating, decreased exercise, and the holiday season. He denies any swelling in his legs or feet. The patient reports walking about a mile to a mile and a half when he can, with good endurance.       Outpatient Medications Prior to Visit  Medication Sig Dispense Refill   DULoxetine  (CYMBALTA ) 60 MG capsule TAKE 1 CAPSULE(60 MG) BY MOUTH DAILY 90 capsule 1   QUEtiapine  (SEROQUEL ) 50 MG tablet Take 2 tablets (100 mg total) by mouth at  bedtime. Annual appt is due in April must see provider for future refills 60 tablet 0   EDARBYCLOR  40-12.5 MG TABS TAKE 1 TABLET BY MOUTH DAILY 90 tablet 0   rosuvastatin  (CRESTOR ) 20 MG tablet TAKE 1 TABLET(20 MG) BY MOUTH DAILY 90 tablet 0   tirzepatide  (ZEPBOUND ) 2.5 MG/0.5ML Pen Inject 2.5 mg into the skin once a week. 4 mL 0   No facility-administered medications prior to visit.    ROS Review of Systems  Constitutional:  Positive for fatigue and unexpected weight change (wt gain). Negative for appetite change, chills and diaphoresis.  HENT: Negative.    Respiratory:  Positive for apnea. Negative for cough, chest tightness, shortness of breath and wheezing.   Cardiovascular:  Negative for chest pain, palpitations and leg swelling.  Gastrointestinal:  Negative for abdominal pain, constipation, diarrhea, nausea and vomiting.  Genitourinary: Negative.  Negative for difficulty urinating.  Musculoskeletal: Negative.   Skin: Negative.   Neurological:  Negative for dizziness, weakness, light-headedness and headaches.  Hematological:  Does not bruise/bleed easily.  Psychiatric/Behavioral:  Positive for confusion and decreased concentration.     Objective:  BP (!) 156/98 (BP Location: Left Arm, Patient Position: Sitting, Cuff Size: Normal)   Pulse 94   Temp 98.3 F (36.8 C) (Oral)   Resp 16   Ht 6' (1.829 m)   Wt 273 lb (123.8 kg)   SpO2 93%   BMI 37.03 kg/m   BP Readings from Last 3 Encounters:  11/08/23 (!) 156/98  03/31/23 (!) 148/102  03/01/22 124/84    Wt Readings from Last 3 Encounters:  11/08/23 273 lb (123.8 kg)  03/31/23 257 lb (116.6 kg)  03/01/22 238 lb (108 kg)    Physical Exam Vitals reviewed.  Constitutional:      General: He is not in acute distress.    Appearance: He is obese. He is not ill-appearing, toxic-appearing or diaphoretic.  HENT:     Nose: Nose normal.     Mouth/Throat:     Mouth: Mucous membranes are moist.  Eyes:     General: No scleral  icterus.    Conjunctiva/sclera: Conjunctivae normal.  Cardiovascular:     Rate and Rhythm: Normal rate and regular rhythm.     Heart sounds: No murmur heard.    No friction rub. No gallop.     Comments: EKG -- NSR, 82 bpm No LVH, Q waves, or ST/T wave changes  Pulmonary:     Effort: Pulmonary effort is normal.     Breath sounds: No stridor. No wheezing, rhonchi or rales.  Abdominal:     General: Abdomen is protuberant. There is no distension.     Palpations: There is no hepatomegaly, splenomegaly or mass.     Tenderness: There is no abdominal tenderness.  Musculoskeletal:        General: Normal range of motion.     Cervical back: Neck supple.     Right lower leg: No edema.     Left lower leg: No edema.  Lymphadenopathy:     Cervical: No cervical adenopathy.  Skin:    General: Skin is warm and dry.     Coloration: Skin is not pale.  Neurological:     General: No focal deficit present.     Mental Status: He is alert. Mental status is at baseline.  Psychiatric:        Mood and Affect: Mood normal.        Behavior: Behavior normal.     Lab Results  Component Value Date   WBC 7.5 11/08/2023   HGB 17.2 (H) 11/08/2023   HCT 50.9 11/08/2023   PLT 204.0 11/08/2023   GLUCOSE 108 (H) 11/08/2023   CHOL 319 (H) 11/08/2023   TRIG (H) 11/08/2023    488.0 Triglyceride is over 400; calculations on Lipids are invalid.   HDL 37.70 (L) 11/08/2023   LDLDIRECT 190.0 11/08/2023   LDLCALC 119 (H) 05/08/2022   ALT 85 (H) 11/08/2023   AST 46 (H) 11/08/2023   NA 138 11/08/2023   K 4.0 11/08/2023   CL 103 11/08/2023   CREATININE 0.94 11/08/2023   BUN 13 11/08/2023   CO2 25 11/08/2023   TSH 2.50 11/08/2023   PSA 0.16 01/01/2022   INR 1.0 11/08/2023   HGBA1C 6.4 11/08/2023    No results found.  Assessment & Plan:   Hypertension, unspecified type- EKG is negative for LVH. Will restart the ARB and thiazide diuretic. -     TSH; Future -     Urinalysis, Routine w reflex microscopic;  Future -     Basic metabolic panel; Future -     CBC with Differential/Platelet; Future -     Aldosterone + renin activity w/ ratio; Future -     EKG 12-Lead  Adenoma of right adrenal gland -     Aldosterone + renin activity w/ ratio; Future  NAFLD (nonalcoholic fatty liver disease) -     Hepatic function panel; Future -     Protime-INR; Future  Essential familial hypercholesterolemia -  Lipid panel; Future -     Rosuvastatin  Calcium ; TAKE 1 TABLET(20 MG) BY MOUTH DAILY  Dispense: 90 tablet; Refill: 0  Screening for colon cancer -     Ambulatory referral to Gastroenterology  Snoring -     Ambulatory referral to Sleep Studies  Obesity (BMI 30-39.9) -     TSH; Future -     Hepatic function panel; Future -     Hemoglobin A1c; Future -     Basic metabolic panel; Future -     Zepbound ; Inject 2.5 mg into the skin once a week.  Dispense: 4 mL; Refill: 0  Primary hypertension -     Edarbyclor ; Take 1 tablet by mouth daily.  Dispense: 90 tablet; Refill: 0  Hypertriglyceridemia, essential -     Omega-3-acid  Ethyl Esters; Take 2 capsules (2 g total) by mouth 2 (two) times daily.  Dispense: 360 capsule; Refill: 0  Other orders -     LDL cholesterol, direct     Follow-up: Return in about 3 months (around 02/06/2024).  Debby Molt, MD

## 2023-11-08 NOTE — Patient Instructions (Signed)
 Hypertension, Adult High blood pressure (hypertension) is when the force of blood pumping through the arteries is too strong. The arteries are the blood vessels that carry blood from the heart throughout the body. Hypertension forces the heart to work harder to pump blood and may cause arteries to become narrow or stiff. Untreated or uncontrolled hypertension can lead to a heart attack, heart failure, a stroke, kidney disease, and other problems. A blood pressure reading consists of a higher number over a lower number. Ideally, your blood pressure should be below 120/80. The first ("top") number is called the systolic pressure. It is a measure of the pressure in your arteries as your heart beats. The second ("bottom") number is called the diastolic pressure. It is a measure of the pressure in your arteries as the heart relaxes. What are the causes? The exact cause of this condition is not known. There are some conditions that result in high blood pressure. What increases the risk? Certain factors may make you more likely to develop high blood pressure. Some of these risk factors are under your control, including: Smoking. Not getting enough exercise or physical activity. Being overweight. Having too much fat, sugar, calories, or salt (sodium) in your diet. Drinking too much alcohol. Other risk factors include: Having a personal history of heart disease, diabetes, high cholesterol, or kidney disease. Stress. Having a family history of high blood pressure and high cholesterol. Having obstructive sleep apnea. Age. The risk increases with age. What are the signs or symptoms? High blood pressure may not cause symptoms. Very high blood pressure (hypertensive crisis) may cause: Headache. Fast or irregular heartbeats (palpitations). Shortness of breath. Nosebleed. Nausea and vomiting. Vision changes. Severe chest pain, dizziness, and seizures. How is this diagnosed? This condition is diagnosed by  measuring your blood pressure while you are seated, with your arm resting on a flat surface, your legs uncrossed, and your feet flat on the floor. The cuff of the blood pressure monitor will be placed directly against the skin of your upper arm at the level of your heart. Blood pressure should be measured at least twice using the same arm. Certain conditions can cause a difference in blood pressure between your right and left arms. If you have a high blood pressure reading during one visit or you have normal blood pressure with other risk factors, you may be asked to: Return on a different day to have your blood pressure checked again. Monitor your blood pressure at home for 1 week or longer. If you are diagnosed with hypertension, you may have other blood or imaging tests to help your health care provider understand your overall risk for other conditions. How is this treated? This condition is treated by making healthy lifestyle changes, such as eating healthy foods, exercising more, and reducing your alcohol intake. You may be referred for counseling on a healthy diet and physical activity. Your health care provider may prescribe medicine if lifestyle changes are not enough to get your blood pressure under control and if: Your systolic blood pressure is above 130. Your diastolic blood pressure is above 80. Your personal target blood pressure may vary depending on your medical conditions, your age, and other factors. Follow these instructions at home: Eating and drinking  Eat a diet that is high in fiber and potassium, and low in sodium, added sugar, and fat. An example of this eating plan is called the DASH diet. DASH stands for Dietary Approaches to Stop Hypertension. To eat this way: Eat  plenty of fresh fruits and vegetables. Try to fill one half of your plate at each meal with fruits and vegetables. Eat whole grains, such as whole-wheat pasta, brown rice, or whole-grain bread. Fill about one  fourth of your plate with whole grains. Eat or drink low-fat dairy products, such as skim milk or low-fat yogurt. Avoid fatty cuts of meat, processed or cured meats, and poultry with skin. Fill about one fourth of your plate with lean proteins, such as fish, chicken without skin, beans, eggs, or tofu. Avoid pre-made and processed foods. These tend to be higher in sodium, added sugar, and fat. Reduce your daily sodium intake. Many people with hypertension should eat less than 1,500 mg of sodium a day. Do not drink alcohol if: Your health care provider tells you not to drink. You are pregnant, may be pregnant, or are planning to become pregnant. If you drink alcohol: Limit how much you have to: 0-1 drink a day for women. 0-2 drinks a day for men. Know how much alcohol is in your drink. In the [place] one drink equals one 12 oz bottle of beer (355 mL), one 5 oz glass of wine (148 mL), or one 1 oz glass of hard liquor (44 mL). Lifestyle  Work with your health care provider to maintain a healthy body weight or to lose weight. Ask what an ideal weight is for you. Get at least 30 minutes of exercise that causes your heart to beat faster (aerobic exercise) most days of the week. Activities may include walking, swimming, or biking. Include exercise to strengthen your muscles (resistance exercise), such as Pilates or lifting weights, as part of your weekly exercise routine. Try to do these types of exercises for 30 minutes at least 3 days a week. Do not use any products that contain nicotine or tobacco. These products include cigarettes, chewing tobacco, and vaping devices, such as e-cigarettes. If you need help quitting, ask your health care provider. Monitor your blood pressure at home as told by your health care provider. Keep all follow-up visits. This is important. Medicines Take over-the-counter and prescription medicines only as told by your health care provider. Follow directions carefully. Blood  pressure medicines must be taken as prescribed. Do not skip doses of blood pressure medicine. Doing this puts you at risk for problems and can make the medicine less effective. Ask your health care provider about side effects or reactions to medicines that you should watch for. Contact a health care provider if you: Think you are having a reaction to a medicine you are taking. Have headaches that keep coming back (recurring). Feel dizzy. Have swelling in your ankles. Have trouble with your vision. Get help right away if you: Develop a severe headache or confusion. Have unusual weakness or numbness. Feel faint. Have severe pain in your chest or abdomen. Vomit repeatedly. Have trouble breathing. These symptoms may be an emergency. Get help right away. Call 911. Do not wait to see if the symptoms will go away. Do not drive yourself to the hospital. Summary Hypertension is when the force of blood pumping through your arteries is too strong. If this condition is not controlled, it may put you at risk for serious complications. Your personal target blood pressure may vary depending on your medical conditions, your age, and other factors. For most people, a normal blood pressure is less than 120/80. Hypertension is treated with lifestyle changes, medicines, or a combination of both. Lifestyle changes include losing weight, eating a healthy,  low-sodium diet, exercising more, and limiting alcohol. This information is not intended to replace advice given to you by your health care provider. Make sure you discuss any questions you have with your health care provider. Document Revised: 09/01/2021 Document Reviewed: 09/01/2021 Elsevier Patient Education  2024 [employer].

## 2023-11-10 DIAGNOSIS — E781 Pure hyperglyceridemia: Secondary | ICD-10-CM | POA: Insufficient documentation

## 2023-11-10 MED ORDER — OMEGA-3-ACID ETHYL ESTERS 1 G PO CAPS: 2.0000 g | ORAL_CAPSULE | Freq: Two times a day (BID) | ORAL | 0 refills | Status: DC

## 2023-11-12 LAB — ALDOSTERONE + RENIN ACTIVITY W/ RATIO
ALDO / PRA Ratio: 3.1 {ratio} (ref 0.9–28.9)
Aldosterone: 11 ng/dL
Renin Activity: 3.58 ng/mL/h (ref 0.25–5.82)

## 2023-11-14 ENCOUNTER — Other Ambulatory Visit (HOSPITAL_COMMUNITY): Payer: Self-pay

## 2023-11-14 ENCOUNTER — Telehealth: Payer: Self-pay

## 2023-11-14 NOTE — Telephone Encounter (Signed)
*  Primary  Pharmacy Patient Advocate Encounter   Received notification from Fax that prior authorization for Edarbyclor  40-12.5MG  tablets  is required/requested.   Insurance verification completed.   The patient is insured through [facility] .   Per test claim: PA required; PA submitted to above mentioned insurance via CoverMyMeds Key/confirmation #/EOC [id number] Status is pending

## 2023-11-15 ENCOUNTER — Other Ambulatory Visit: Payer: Self-pay | Admitting: Internal Medicine

## 2023-11-15 DIAGNOSIS — I10 Essential (primary) hypertension: Secondary | ICD-10-CM

## 2023-11-15 MED ORDER — OLMESARTAN MEDOXOMIL 20 MG PO TABS: 20.0000 mg | ORAL_TABLET | Freq: Every day | ORAL | 0 refills | Status: DC

## 2023-11-15 MED ORDER — INDAPAMIDE 1.25 MG PO TABS: 1.2500 mg | ORAL_TABLET | Freq: Every day | ORAL | 0 refills | Status: DC

## 2023-11-15 NOTE — Telephone Encounter (Signed)
 Would you like me to advise the patient that this medication has been denied or are we trying something different ?

## 2023-11-15 NOTE — Telephone Encounter (Signed)
 Pharmacy Patient Advocate Encounter  Received notification from [facility] that Prior Authorization for Edarbyclor  40-12.5mg  tabs has been DENIED.  No reason given; No denial letter received via Fax or CMM. It has been requested and will be uploaded to the media tab once received.   PA #/Case ID/Reference #: [id number]

## 2023-11-16 ENCOUNTER — Encounter: Payer: Self-pay | Admitting: Internal Medicine

## 2023-11-18 ENCOUNTER — Telehealth: Payer: Self-pay

## 2023-11-18 NOTE — Progress Notes (Signed)
 Care Guide Pharmacy Note  11/18/2023 Name: Anthony Nielsen MRN: [id number] DOB: [birth year]  Referred By: Joshua Debby CROME, MD Reason for referral: Care Coordination (Outreach to schedule with Pharm d )   Anthony Nielsen is a 48 y.o. year old male who is a primary care patient of Joshua Debby CROME, MD.  Anthony Nielsen was referred to the pharmacist for assistance related to: HTN  Successful contact was made with the patient to discuss pharmacy services.  Patient declines engagement at this time. Contact information was provided to the patient should they wish to reach out for assistance at a later time.  Anthony Nielsen , RMA     [facility]  [facility] Guide  Direct Dial: [phone]  Website: [email or web address]

## 2023-11-30 ENCOUNTER — Ambulatory Visit: Payer: Self-pay | Admitting: Internal Medicine

## 2023-11-30 ENCOUNTER — Other Ambulatory Visit: Payer: Self-pay | Admitting: Internal Medicine

## 2023-11-30 MED ORDER — EDARBYCLOR 40-12.5 MG PO TABS: 1.0000 | ORAL_TABLET | Freq: Every day | ORAL | 1 refills | Status: DC

## 2023-11-30 NOTE — Addendum Note (Signed)
Addended by: Etta Grandchild on: 11/30/2023 02:01 PM   Modules accepted: Orders

## 2023-12-01 ENCOUNTER — Other Ambulatory Visit: Payer: Self-pay | Admitting: Internal Medicine

## 2023-12-01 DIAGNOSIS — E669 Obesity, unspecified: Secondary | ICD-10-CM

## 2023-12-05 ENCOUNTER — Ambulatory Visit: Payer: Medicaid Other | Admitting: Podiatry

## 2023-12-06 ENCOUNTER — Ambulatory Visit (AMBULATORY_SURGERY_CENTER): Payer: Medicaid Other | Admitting: *Deleted

## 2023-12-06 VITALS — Ht 72.0 in | Wt 265.0 lb

## 2023-12-06 DIAGNOSIS — Z1211 Encounter for screening for malignant neoplasm of colon: Secondary | ICD-10-CM

## 2023-12-06 DIAGNOSIS — Z800 Family history of malignant neoplasm of digestive organs: Secondary | ICD-10-CM

## 2023-12-06 MED ORDER — SUFLAVE 178.7 G PO SOLR: 1.0000 | ORAL | 0 refills | Status: DC

## 2023-12-06 NOTE — Progress Notes (Signed)
Pt's name and DOB verified at the beginning of the pre-visit wit 2 identifiers  Pt denies any difficulty with ambulating,sitting, laying down or rolling side to side  Pt has no issues with ambulation   Pt has no issues moving head neck or swallowing  No egg or soy allergy known to patient   No issues known to pt with past sedation with any surgeries or procedures  Pt denies having issues being intubated  No FH of Malignant Hyperthermia  Pt is not on diet pills or shots  Pt is not on home 02   Pt is not on blood thinners   Pt denies issues with constipation   Pt is not on dialysis  Pt denise any abnormal heart rhythms   Pt denies any upcoming cardiac testing  Pt encouraged to use to use Singlecare or Goodrx to reduce cost   Patient's chart reviewed by Cathlyn Parsons CNRA prior to pre-visit and patient appropriate for the [facility].  Pre-visit completed and red dot placed by patient's name on their procedure day (on provider's schedule).  .  Visit by phone  Pt states weight is 265 lb  Instructed pt why it is important to and  to call if they have any changes in health or new medications. Directed them to the # given and on instructions.     Instructions reviewed. Pt given both [facility] main # and MD on call # prior to instructions.  Pt states understanding. Instructed to review again prior to procedure. Pt states they will.   Informed pt that they will receive a call from [facility] regarding there prep med.

## 2023-12-13 ENCOUNTER — Encounter: Payer: Self-pay | Admitting: Podiatry

## 2023-12-13 ENCOUNTER — Ambulatory Visit (INDEPENDENT_AMBULATORY_CARE_PROVIDER_SITE_OTHER): Payer: Medicaid Other | Admitting: Podiatry

## 2023-12-13 DIAGNOSIS — M722 Plantar fascial fibromatosis: Secondary | ICD-10-CM | POA: Diagnosis not present

## 2023-12-13 NOTE — Progress Notes (Signed)
  Subjective:  Patient ID: Anthony Nielsen, male    DOB: August 14, 1976,   MRN: [id number]  Chief Complaint  Patient presents with   Foot Pain    Pt present for pain on the bottom of his right foot states he injured it over a year ago and it never got better.    48 y.o. male presents for concern of right foot. Relates a year ago he had a laceration that was stitched up. Relates he feels he has developed a hematoma underlying and wanted his foot evaluated. Relates it is not painful and has been getting some smaller but was concerned for how long it has been present.   . Denies any other pedal complaints. Denies n/v/f/c.   Past Medical History:  Diagnosis Date   Hyperlipidemia    Hypertension    Smoker     Objective:  Physical Exam: Vascular: DP/PT pulses 2/4 bilateral. CFT <3 seconds. Normal hair growth on digits. No edema.  Skin. No lacerations or abrasions bilateral feet.  Musculoskeletal: MMT 5/5 bilateral lower extremities in DF, PF, Inversion and Eversion. Deceased ROM in DF of ankle joint. Plantar medial right foot with 2 cm nodule undelying previous scar. Lesion is firm and mobile with the plantar fascia. No pain to palpation.   Neurological: Sensation intact to light touch.   Assessment:   1. Plantar fascial fibromatosis      Plan:  Patient was evaluated and treated and all questions answered. X-rays reviewed and discussed with patient. Some edema noted in plantar foot along fascia noted. No dislocations or fractures noted.  Discussed the diagnosis of fibroma and treatment options with patient. Discussed stretching and massage to break up fibers and aid with disappearance. Discussed this is not guaranteed.  Discussed offloading of the area and proper shoe wear. Discussed steroid injection into the area in the future if needed.  Did discuss surgical options and advised patient that fibroma resection is not 100% guaranteed. Patient to follow-up as needed if symptoms fail to  improve or worsen.   Asberry Failing, DPM

## 2023-12-13 NOTE — Patient Instructions (Signed)

## 2023-12-14 ENCOUNTER — Ambulatory Visit (INDEPENDENT_AMBULATORY_CARE_PROVIDER_SITE_OTHER): Payer: Medicaid Other | Admitting: Neurology

## 2023-12-14 ENCOUNTER — Encounter: Payer: Self-pay | Admitting: Neurology

## 2023-12-14 VITALS — BP 97/67 | HR 82 | Ht 72.0 in | Wt 273.0 lb

## 2023-12-14 DIAGNOSIS — F331 Major depressive disorder, recurrent, moderate: Secondary | ICD-10-CM

## 2023-12-14 DIAGNOSIS — E0849 Diabetes mellitus due to underlying condition with other diabetic neurological complication: Secondary | ICD-10-CM

## 2023-12-14 DIAGNOSIS — F5104 Psychophysiologic insomnia: Secondary | ICD-10-CM

## 2023-12-14 DIAGNOSIS — Z800 Family history of malignant neoplasm of digestive organs: Secondary | ICD-10-CM

## 2023-12-14 DIAGNOSIS — E661 Drug-induced obesity: Secondary | ICD-10-CM | POA: Insufficient documentation

## 2023-12-14 DIAGNOSIS — F17210 Nicotine dependence, cigarettes, uncomplicated: Secondary | ICD-10-CM

## 2023-12-14 DIAGNOSIS — Z720 Tobacco use: Secondary | ICD-10-CM

## 2023-12-14 DIAGNOSIS — K760 Fatty (change of) liver, not elsewhere classified: Secondary | ICD-10-CM

## 2023-12-14 DIAGNOSIS — R0683 Snoring: Secondary | ICD-10-CM | POA: Diagnosis not present

## 2023-12-14 DIAGNOSIS — Z6837 Body mass index (BMI) 37.0-37.9, adult: Secondary | ICD-10-CM

## 2023-12-14 DIAGNOSIS — E66812 Obesity, class 2: Secondary | ICD-10-CM | POA: Diagnosis not present

## 2023-12-14 DIAGNOSIS — Z1211 Encounter for screening for malignant neoplasm of colon: Secondary | ICD-10-CM

## 2023-12-14 NOTE — Patient Instructions (Addendum)
 Sleep Apnea Test: What to Expect  Sleep apnea is a condition that affects your breathing while you're sleeping. You may have shallow breathing or stop breathing for short periods of time. Sleep apnea screening is a test to check if you're at risk for sleep apnea. The test includes questions. It will only takes a few minutes. Your health care provider may ask you to have this test before a surgery or as part of a physical exam. What are the symptoms of sleep apnea? Snoring. Waking up often at night. Daytime sleepiness. Pauses in breathing. Choking or gasping during sleep. Being annoyed easily. Forgetfulness. Trouble thinking clearly. Depression. Personality changes. Headaches in the morning. Most people with sleep apnea do not know that they have it. What are the advantages of sleep apnea screening? Getting screened for sleep apnea can help: Keep you safer. Your providers need to know whether or not you have sleep apnea, especially if you're having surgery or have other long-term, or chronic, health conditions. Improve your health and help you get a better night's rest. Restful sleep can help you: Have more energy. Lose weight. Improve high blood pressure. Improve diabetes management. Prevent stroke. Prevent car accidents. What happens before the screening? You may talk with your provider about the screening and what other tests may be recommended based on the screening. What happens during the screening? Screening usually includes being asked a list of questions about your sleep quality. Some questions you may be asked include: Do you snore? Is your sleep restless? Do you have daytime sleepiness? Has a partner or spouse told you that you stop breathing, choke, or gasp during sleep? Have you had trouble concentrating or memory loss? What is your age? What is your neck circumference? To measure your neck, keep your back straight and gently wrap the tape measure around your  neck. Put the tape measure at the middle of your neck, between your chin and collarbone. What is your sex assigned at birth? Do you have high blood pressure or are you being treated for high blood pressure? If your screening test is positive, you're at risk for the condition. More tests may be needed to confirm a diagnosis of sleep apnea. What can I expect after the screening? Your provider will go over the results of the screening with you and make recommendations based on the results of the test. Where to find more information You can find screening tools online or at your health care clinic. To learn more, go to these websites: Centers for Disease Control and Prevention: Diningcalendar.de. Then: Click Health Topics A-Z. Type sleep apnea in the search box. National Heart, Lung, and Blood Institute: buffalodrycleaner.gl Contact a health care provider if: You think that you may have sleep apnea. This information is not intended to replace advice given to you by your health care provider. Make sure you discuss any questions you have with your health care provider. Document Revised: 04/02/2023 Document Reviewed: 04/02/2023 Elsevier Patient Education  2024 Elsevier Inc.ASSESSMENT AND PLAN 48 y.o. male  here with:   Insomnia, chronic and starting in 2020, has not been on anti-depression meds since the beginning of the year 2023   Had slept better on Seroquel  but let to weight gain. Now using advil PM and having dry mouth , fogginess ,daytime drowsiness.     1)  my main goal will be to check and screen for improved sleep quality and underlying organic sleep disorders.   2)  establish sleep routines,  trying to initiate sleep before midnight- delta sleep  is associated with insulin balance.   Plan :   Screening for OSA , by HST.  Rsik factors are  upper air way anatomy, neck size, and BMI and history of  snoring.    HST ordered, sleep quality education.   I plan to follow up either personally or  through our NP within 3-4 months.   I would like to thank  Joshua Debby CROME, Md [address] [place],  [place] [address] for allowing me to meet with and to take care of this pleasant patient.  Insomnia Insomnia is a sleep disorder that makes it difficult to fall asleep or stay asleep. Insomnia can cause fatigue, low energy, difficulty concentrating, mood swings, and poor performance at work or school. There are three different ways to classify insomnia: Difficulty falling asleep. Difficulty staying asleep. Waking up too early in the morning. Any type of insomnia can be long-term (chronic) or short-term (acute). Both are common. Short-term insomnia usually lasts for 3 months or less. Chronic insomnia occurs at least three times a week for longer than 3 months. What are the causes? Insomnia may be caused by another condition, situation, or substance, such as: Having certain mental health conditions, such as anxiety and depression. Using caffeine, alcohol, tobacco, or drugs. Having gastrointestinal conditions, such as gastroesophageal reflux disease (GERD). Having certain medical conditions. These include: Asthma. Alzheimer's disease. Stroke. Chronic pain. An overactive thyroid  gland (hyperthyroidism). Other sleep disorders, such as restless legs syndrome and sleep apnea. Menopause. Sometimes, the cause of insomnia may not be known. What increases the risk? Risk factors for insomnia include: Gender. Females are affected more often than males. Age. Insomnia is more common as people get older. Stress and certain medical and mental health conditions. Lack of exercise. Having an irregular work schedule. This may include working night shifts and traveling between different time zones. What are the signs or symptoms? If you have insomnia, the main symptom is having trouble falling asleep or having trouble staying asleep. This may lead to other symptoms, such as: Feeling tired or having low  energy. Feeling nervous about going to sleep. Not feeling rested in the morning. Having trouble concentrating. Feeling irritable, anxious, or depressed. How is this diagnosed? This condition may be diagnosed based on: Your symptoms and medical history. Your health care provider may ask about: Your sleep habits. Any medical conditions you have. Your mental health. A physical exam. How is this treated? Treatment for insomnia depends on the cause. Treatment may focus on treating an underlying condition that is causing the insomnia. Treatment may also include: Medicines to help you sleep. Counseling or therapy. Lifestyle adjustments to help you sleep better. Follow these instructions at home: Eating and drinking  Limit or avoid alcohol, caffeinated beverages, and products that contain nicotine and tobacco, especially close to bedtime. These can disrupt your sleep. Do not eat a large meal or eat spicy foods right before bedtime. This can lead to digestive discomfort that can make it hard for you to sleep. Sleep habits  Keep a sleep diary to help you and your health care provider figure out what could be causing your insomnia. Write down: When you sleep. When you wake up during the night. How well you sleep and how rested you feel the next day. Any side effects of medicines you are taking. What you eat and drink. Make your bedroom a dark, comfortable place where it is easy to fall asleep. Put  up shades or blackout curtains to block light from outside. Use a white noise machine to block noise. Keep the temperature cool. Limit screen use before bedtime. This includes: Not watching TV. Not using your smartphone, tablet, or computer. Stick to a routine that includes going to bed and waking up at the same times every day and night. This can help you fall asleep faster. Consider making a quiet activity, such as reading, part of your nighttime routine. Try to avoid taking naps during the day  so that you sleep better at night. Get out of bed if you are still awake after 15 minutes of trying to sleep. Keep the lights down, but try reading or doing a quiet activity. When you feel sleepy, go back to bed. General instructions Take over-the-counter and prescription medicines only as told by your health care provider. Exercise regularly as told by your health care provider. However, avoid exercising in the hours right before bedtime. Use relaxation techniques to manage stress. Ask your health care provider to suggest some techniques that may work well for you. These may include: Breathing exercises. Routines to release muscle tension. Visualizing peaceful scenes. Make sure that you drive carefully. Do not drive if you feel very sleepy. Keep all follow-up visits. This is important. Contact a health care provider if: You are tired throughout the day. You have trouble in your daily routine due to sleepiness. You continue to have sleep problems, or your sleep problems get worse. Get help right away if: You have thoughts about hurting yourself or someone else. Get help right away if you feel like you may hurt yourself or others, or have thoughts about taking your own life. Go to your nearest emergency room or: Call 911. Call the National Suicide Prevention Lifeline at 614-395-7137 or 988. This is open 24 hours a day. Text the Crisis Text Line at 802 410 3688. Summary Insomnia is a sleep disorder that makes it difficult to fall asleep or stay asleep. Insomnia can be long-term (chronic) or short-term (acute). Treatment for insomnia depends on the cause. Treatment may focus on treating an underlying condition that is causing the insomnia. Keep a sleep diary to help you and your health care provider figure out what could be causing your insomnia. This information is not intended to replace advice given to you by your health care provider. Make sure you discuss any questions you have with your  health care provider. Document Revised: 10/05/2021 Document Reviewed: 10/05/2021 Elsevier Patient Education  2024 [employer].

## 2023-12-14 NOTE — Progress Notes (Signed)
 SLEEP MEDICINE CLINIC    Provider:  Dedra Gores, MD  Primary Care Physician:  Joshua Debby CROME, MD [address] [place] [address]     Referring Provider: Joshua Debby CROME, Md [address] [place],  [place] [address]          Chief Complaint according to patient   Patient presents with:     New Patient (Initial Visit) Insomnia            HISTORY OF PRESENT ILLNESS:  Anthony Nielsen is a 48 y.o. male patient who is seen upon  Dr. Joshua' referral on 12/14/2023  for a Sleep consultation .  Chief concern according to patient :   I have trouble to fall asleep , and also to stay asleep.  Started  during  or after the pandemic, I live with my mother and I am a caregiver, house sitting.    I have the pleasure of seeing CASSIE HENKELS on 12/14/23,  a right -handed male with an insomnia sleep concern.     Sleep relevant medical history: no Nocturia/ , no  Sleep walking hx,  no ENT/ Tonsillectomy, broken nose with septal deviation-  right nasion is narrow-  has had 2 micro-discectomies, Lumbar :  L4-5 .      Family medical /sleep history: no other family member on CPAP with OSA.    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  Bachelor degree in business.  Patient is  not  gainfully employed- and lives in a household with his mother-  no pets,  Family status is divorced , with 2-3 children. The patient used to work in shifts( chief technology officer,) until 2016.   Tobacco use: active - cigarettes  10 a day .   ETOH use : 1-2/month,  Caffeine intake in form of Coffee( /) Soda( coke at dinner ) Tea ( /) or energy drinks Exercise ; not regularly        Sleep habits are as follows: The patient's dinner time is between 6 PM.  This patient takes Advil PM and melatonin and Seroquel - The patient goes to bed at 9.30-10  PM and asleep by 12.30 AM , continues to sleep for 8 hours, no  bathroom breaks, but wakes up   for other triggers: the first time at 2-3  AM.  Bedroom cool, quiet band dark.  The preferred sleep position is side ways , with the support of  2-4 pillows.  Dreams are reportedly frequent.   The patient wakes up spontaneously at 800., 900  AM is the usual rise time. He  reports not feeling refreshed or restored in AM, with symptoms such as dry mouth, but no morning headaches, no cluster headaches, only back stiffness. Sciatica. , and residual fatigue.  Naps are taken frequently, lasting from 3-5 PM and  lasting 45  -90  minutes and are refreshing..    Review of Systems: Out of a complete 14 system review, the patient complains of only the following symptoms, and all other reviewed systems are negative.:  Fatigue, sleepiness , snoring, witnessed - but no apnea   fragmented sleep, Insomnia,   How likely are you to doze in the following situations: 0 = not likely, 1 = slight chance, 2 = moderate chance, 3 = high chance   Sitting and Reading? 0 Watching Television?1 Sitting inactive in a public place (theater or  meeting)? 0 As a passenger in a car for an hour without a break?3 Lying down in the afternoon when circumstances permit 3 Sitting and talking to someone?1 Sitting quietly after lunch without alcohol?  Takes naps. !! In a car, while stopped for a few minutes in traffic?0   Total = 8/ 24 points   FSS endorsed at 36/ 63 points.   Social History   Socioeconomic History   Marital status: Single    Spouse name: Not on file   Number of children: Not on file   Years of education: Not on file   Highest education level: Bachelor's degree (e.g., BA, AB, BS)  Occupational History   Not on file  Tobacco Use   Smoking status: Every Day    Current packs/day: 5.00    Average packs/day: 5.0 packs/day for 30.0 years (150.0 ttl pk-yrs)    Types: Cigarettes   Smokeless tobacco: Never  Vaping Use   Vaping status: Never Used  Substance and Sexual Activity   Alcohol use: Not Currently    Drug use: Not Currently    Types: Marijuana   Sexual activity: Not Currently  Other Topics Concern   Not on file  Social History Narrative   Pt lives with mom    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   Social Drivers of Health   Financial Resource Strain: Patient Declined (11/08/2023)   Overall Financial Resource Strain (CARDIA)    Difficulty of Paying Living Expenses: Patient declined  Food Insecurity: No Food Insecurity (11/08/2023)   Hunger Vital Sign    Worried About Running Out of Food in the Last Year: Never true    Ran Out of Food in the Last Year: Never true  Transportation Needs: No Transportation Needs (11/08/2023)   PRAPARE - Administrator, Civil Service (Medical): No    Lack of Transportation (Non-Medical): No  Physical Activity: Unknown (11/08/2023)   Exercise Vital Sign    Days of Exercise per Week: Patient declined    Minutes of Exercise per Session: 20 min  Stress: Stress Concern Present (11/08/2023)   [employer] of Occupational Health - Occupational Stress Questionnaire    Feeling of Stress : To some extent  Social Connections: Unknown (11/08/2023)   Social Connection and Isolation Panel [NHANES]    Frequency of Communication with Friends and Family: More than three times a week    Frequency of Social Gatherings with Friends and Family: Patient declined    Attends Religious Services: Patient declined    Database Administrator or Organizations: No    Attends Engineer, Structural: Not on file    Marital Status: Patient declined    Family History  Problem Relation Age of Onset   Colon cancer Mother    Hypertension Mother    Osteoarthritis Mother    Cancer Father        pancreas   Colon polyps Neg Hx    Esophageal cancer Neg Hx    Stomach cancer Neg Hx    Rectal cancer Neg Hx    Sleep apnea Neg Hx     Past Medical History:  Diagnosis Date   Hyperlipidemia    Hypertension    Smoker     Past Surgical History:  Procedure Laterality  Date   BACK SURGERY  05/18/2021   INGUINAL HERNIA REPAIR Right 09/1998   LUMBAR LAMINECTOMY/DECOMPRESSION MICRODISCECTOMY Right 01/13/2022   Procedure: Redo Microdiscectomy  - Lumbar five-Sacral one - right;  Surgeon: Joshua Alm RAMAN, MD;  Location: [facility] OR;  Service: Neurosurgery;  Laterality: Right;     Current Outpatient Medications on File Prior to Visit  Medication Sig Dispense Refill   Azilsartan-Chlorthalidone  (EDARBYCLOR ) 40-12.5 MG TABS Take 1 tablet by mouth daily. 90 tablet 1   omega-3 acid ethyl esters (LOVAZA ) 1 g capsule Take 2 capsules (2 g total) by mouth 2 (two) times daily. 360 capsule 0   rosuvastatin  (CRESTOR ) 20 MG tablet TAKE 1 TABLET(20 MG) BY MOUTH DAILY 90 tablet 0   DULoxetine  (CYMBALTA ) 60 MG capsule TAKE 1 CAPSULE(60 MG) BY MOUTH DAILY (Patient not taking: Reported on 12/06/2023) 90 capsule 1   PEG 3350-KCl-NaCl-NaSulf-MgSul (SUFLAVE ) 178.7 g SOLR Take 1 kit by mouth as directed. (Patient not taking: Reported on 12/14/2023) 1 each 0   QUEtiapine  (SEROQUEL ) 50 MG tablet Take 2 tablets (100 mg total) by mouth at bedtime. Annual appt is due in April must see provider for future refills (Patient not taking: Reported on 12/06/2023) 60 tablet 0   tirzepatide  (ZEPBOUND ) 2.5 MG/0.5ML Pen Inject 2.5 mg into the skin once a week. (Patient not taking: Reported on 12/14/2023) 4 mL 0   No current facility-administered medications on file prior to visit.    No Known Allergies   DIAGNOSTIC DATA (LABS, IMAGING, TESTING) - I reviewed patient records, labs, notes, testing and imaging myself where available.  Lab Results  Component Value Date   WBC 7.5 11/08/2023   HGB 17.2 (H) 11/08/2023   HCT 50.9 11/08/2023   MCV 96.0 11/08/2023   PLT 204.0 11/08/2023      Component Value Date/Time   NA 138 11/08/2023 0955   K 4.0 11/08/2023 0955   CL 103 11/08/2023 0955   CO2 25 11/08/2023 0955   GLUCOSE 108 (H) 11/08/2023 0955   BUN 13 11/08/2023 0955   CREATININE 0.94 11/08/2023 0955    CALCIUM  10.1 11/08/2023 0955   PROT 7.3 11/08/2023 0955   ALBUMIN 4.7 11/08/2023 0955   AST 46 (H) 11/08/2023 0955   ALT 85 (H) 11/08/2023 0955   ALKPHOS 60 11/08/2023 0955   BILITOT 0.4 11/08/2023 0955   GFRNONAA >60 05/08/2022 2035   Lab Results  Component Value Date   CHOL 319 (H) 11/08/2023   HDL 37.70 (L) 11/08/2023   LDLCALC 119 (H) 05/08/2022   LDLDIRECT 190.0 11/08/2023   TRIG (H) 11/08/2023    488.0 Triglyceride is over 400; calculations on Lipids are invalid.   CHOLHDL 8 11/08/2023   Lab Results  Component Value Date   HGBA1C 6.4 11/08/2023   No results found for: [id number] Lab Results  Component Value Date   TSH 2.50 11/08/2023    PHYSICAL EXAM:  Today's Vitals   12/14/23 1027  BP: 97/67  Pulse: 82  Weight: 273 lb (123.8 kg)  Height: 6' (1.829 m)   Body mass index is 37.03 kg/m.   Wt Readings from Last 3 Encounters:  12/14/23 273 lb (123.8 kg)  12/06/23 265 lb (120.2 kg)  11/08/23 273 lb (123.8 kg)     Ht Readings from Last 3 Encounters:  12/14/23 6' (1.829 m)  12/06/23 6' (1.829 m)  11/08/23 6' (1.829 m)      General: The patient is awake, alert and appears not in acute distress. The patient is well groomed. Head: Normocephalic, atraumatic. Neck is supple.   Mallampati :  3 plus ,  neck circumference:20 inches .  Nasal airflow is not fully patent.  Retrognathia is not seen. Overbite, wore braces, crowded. Small  mouth.  Dental status: biological  Cardiovascular:  Regular rate and cardiac rhythm by pulse,  without distended neck veins. Respiratory: Lungs are clear to auscultation.  Skin:  Without evidence of ankle edema, or rash. Trunk: The patient's posture is erect.   NEUROLOGIC EXAM: The patient is awake and alert, oriented to place and time.   Memory subjective described as intact.  Attention span & concentration ability appears normal.  Speech is fluent,  without  dysarthria, dysphonia or aphasia.  Mood and affect are  appropriate.   Cranial nerves: impaired  smell or taste reported - since COVID 2021.  Pupils are equal and briskly reactive to light. Funduscopic exam deferred. .  Extraocular movements in vertical and horizontal planes were intact and without nystagmus. No Diplopia. Visual fields by finger perimetry are intact. Hearing was intact to soft voice and finger rubbing.    Facial sensation intact to fine touch.  Facial motor strength is symmetric and tongue and uvula move midline.  Neck ROM : rotation, tilt and flexion extension were normal for age and shoulder shrug was symmetrical.    Motor exam:  Symmetric bulk, tone and ROM.   Normal tone without cog wheeling, symmetric grip strength .   Sensory:  Fine touch,vibration were - he reports pin and needle sensation in both feet   Proprioception tested in the upper extremities was normal.   Coordination: Rapid alternating movements in the fingers/hands were of normal speed.  The Finger-to-nose maneuver was intact without evidence of ataxia, dysmetria , there was a coarse action tremor more pronounced on the left .   Gait and station: Patient could rise unassisted from a seated position, walked without assistive device.  Stance is of normal width/ base and the patient turned with 3.5 steps.  Toe and heel walk were deferred.  Deep tendon reflexes: in the  upper and lower extremities are present,  left leg patella more brisk than right.  Babinski response was deferred.    ASSESSMENT AND PLAN 48 y.o. male  here with:   Insomnia, chronic and starting in 2020, has not been on anti-depression meds since the beginning of the year 2023   Had slept better on Seroquel  but let to weight gain. Now using advil PM and having dry mouth , fogginess ,daytime drowsiness.     1)  my main goal will be to check and screen for improved sleep quality and underlying organic sleep disorders.   2)  establish sleep routines,  trying to initiate sleep before  midnight- delta sleep  is associated with insulin balance.   Plan :   Screening for OSA , by HST.  Rsik factors are  upper air way anatomy, neck size, and BMI and history of  snoring.    HST ordered, sleep quality education.   I plan to follow up either personally or through our NP within 3-4 months.   I would like to thank  Joshua Debby CROME, Md [address] [place],  [place] [address] for allowing me to meet with and to take care of this pleasant patient.    After spending a total time of  45  minutes face to face and additional time for physical and neurologic examination, review of laboratory studies,  personal review of imaging studies, reports and results of other testing and review of referral information / records as far as provided in visit,   Electronically signed by: Dedra Gores, MD 12/14/2023 11:09 AM  [facility] Board certified  by The American Board of Sleep Medicine and Diplomate of the [employer]. Board certified In Neurology through the ABPN, Fellow of the [employer].

## 2023-12-16 ENCOUNTER — Telehealth: Payer: Self-pay | Admitting: Neurology

## 2023-12-16 NOTE — Telephone Encounter (Signed)
 [facility] [employer] pending faxed notes.

## 2023-12-19 NOTE — Telephone Encounter (Signed)
 [facility] [employer] no auth req via fax

## 2023-12-27 ENCOUNTER — Encounter: Payer: Medicaid Other | Admitting: Gastroenterology

## 2023-12-28 ENCOUNTER — Other Ambulatory Visit: Payer: Self-pay | Admitting: Internal Medicine

## 2023-12-28 DIAGNOSIS — F331 Major depressive disorder, recurrent, moderate: Secondary | ICD-10-CM

## 2024-01-03 ENCOUNTER — Other Ambulatory Visit (HOSPITAL_COMMUNITY): Payer: Self-pay

## 2024-01-03 ENCOUNTER — Telehealth: Payer: Self-pay

## 2024-01-03 NOTE — Telephone Encounter (Signed)
 Pharmacy Patient Advocate Encounter   Received notification from Onbase that prior authorization for Zepbound 2.5MG /0.5ML pen-injectors is required/requested.   Insurance verification completed.   The patient is insured through [facility] .   Per test claim: PA required; PA submitted to above mentioned insurance via CoverMyMeds Key/confirmation #/EOC [id number] Status is pending

## 2024-01-05 NOTE — Telephone Encounter (Signed)
 Pharmacy Patient Advocate Encounter  Received notification from [facility] that Prior Authorization for Zepbound 2.5MG /0.5ML pen-injectors has been DENIED.  Full denial letter will be uploaded to the media tab. See denial reason below.  Here are the policy requirements your request did not meet: Your doctor must provide documentation of all the following:  Your starting weight and Body Mass Index (BMI, an estimate of body fat based on height and weight) within the past 45 days.  You must have documentation that you have tried and failed the preferred drug Wegovy for at least 3 to 6 months or have a medical reason why you cannot use Wegovy.   PA #/Case ID/Reference #: [id number]

## 2024-01-05 NOTE — Telephone Encounter (Signed)
 Patient has been made aware.

## 2024-02-01 ENCOUNTER — Other Ambulatory Visit: Payer: Self-pay | Admitting: Internal Medicine

## 2024-02-01 DIAGNOSIS — E781 Pure hyperglyceridemia: Secondary | ICD-10-CM

## 2024-02-01 DIAGNOSIS — I10 Essential (primary) hypertension: Secondary | ICD-10-CM

## 2024-02-01 DIAGNOSIS — E7801 Familial hypercholesterolemia: Secondary | ICD-10-CM

## 2024-02-01 MED ORDER — ROSUVASTATIN CALCIUM 20 MG PO TABS: ORAL_TABLET | ORAL | 0 refills | Status: DC

## 2024-02-01 MED ORDER — OMEGA-3-ACID ETHYL ESTERS 1 G PO CAPS
2.0000 g | ORAL_CAPSULE | Freq: Two times a day (BID) | ORAL | 0 refills | Status: DC
Start: 2024-02-01 — End: 2024-02-03

## 2024-02-02 ENCOUNTER — Ambulatory Visit: Admitting: Internal Medicine

## 2024-02-02 ENCOUNTER — Encounter: Payer: Self-pay | Admitting: Internal Medicine

## 2024-02-02 VITALS — BP 104/72 | HR 91 | Temp 97.9°F | Ht 72.0 in | Wt 275.6 lb

## 2024-02-02 DIAGNOSIS — I950 Idiopathic hypotension: Secondary | ICD-10-CM

## 2024-02-02 DIAGNOSIS — I10 Essential (primary) hypertension: Secondary | ICD-10-CM

## 2024-02-02 DIAGNOSIS — Z7985 Long-term (current) use of injectable non-insulin antidiabetic drugs: Secondary | ICD-10-CM

## 2024-02-02 DIAGNOSIS — K760 Fatty (change of) liver, not elsewhere classified: Secondary | ICD-10-CM | POA: Diagnosis not present

## 2024-02-02 DIAGNOSIS — E7801 Familial hypercholesterolemia: Secondary | ICD-10-CM | POA: Diagnosis not present

## 2024-02-02 DIAGNOSIS — E781 Pure hyperglyceridemia: Secondary | ICD-10-CM

## 2024-02-02 DIAGNOSIS — E119 Type 2 diabetes mellitus without complications: Secondary | ICD-10-CM | POA: Diagnosis not present

## 2024-02-02 LAB — URINALYSIS, ROUTINE W REFLEX MICROSCOPIC
Hgb urine dipstick: NEGATIVE
Ketones, ur: NEGATIVE
Leukocytes,Ua: NEGATIVE
Nitrite: NEGATIVE
RBC / HPF: NONE SEEN (ref 0–?)
Specific Gravity, Urine: >=1.03 — AB (ref 1.000–1.030)
Total Protein, Urine: NEGATIVE
Urine Glucose: NEGATIVE
Urobilinogen, UA: 0.2 (ref 0.0–1.0)
pH: 5.5 (ref 5.0–8.0)

## 2024-02-02 LAB — CBC WITH DIFFERENTIAL/PLATELET
Basophils Absolute: 0.1 10*3/uL (ref 0.0–0.1)
Basophils Relative: 0.6 % (ref 0.0–3.0)
Eosinophils Absolute: 0.2 10*3/uL (ref 0.0–0.7)
Eosinophils Relative: 2.9 % (ref 0.0–5.0)
HCT: 46.8 % (ref 39.0–52.0)
Hemoglobin: 15.8 g/dL (ref 13.0–17.0)
Lymphocytes Relative: 39.3 % (ref 12.0–46.0)
Lymphs Abs: 3.4 10*3/uL (ref 0.7–4.0)
MCHC: 33.8 g/dL (ref 30.0–36.0)
MCV: 95.7 fl (ref 78.0–100.0)
Monocytes Absolute: 0.5 10*3/uL (ref 0.1–1.0)
Monocytes Relative: 5.3 % (ref 3.0–12.0)
Neutro Abs: 4.4 10*3/uL (ref 1.4–7.7)
Neutrophils Relative %: 51.9 % (ref 43.0–77.0)
Platelets: 213 10*3/uL (ref 150.0–400.0)
RBC: 4.9 Mil/uL (ref 4.22–5.81)
RDW: 14 % (ref 11.5–15.5)
WBC: 8.5 10*3/uL (ref 4.0–10.5)

## 2024-02-02 LAB — HEPATIC FUNCTION PANEL
ALT: 86 U/L — ABNORMAL HIGH (ref 0–53)
AST: 42 U/L — ABNORMAL HIGH (ref 0–37)
Albumin: 4.7 g/dL (ref 3.5–5.2)
Alkaline Phosphatase: 55 U/L (ref 39–117)
Bilirubin, Direct: 0.1 mg/dL (ref 0.0–0.3)
Total Bilirubin: 0.3 mg/dL (ref 0.2–1.2)
Total Protein: 7.4 g/dL (ref 6.0–8.3)

## 2024-02-02 LAB — BASIC METABOLIC PANEL WITH GFR
BUN: 24 mg/dL — ABNORMAL HIGH (ref 6–23)
CO2: 26 meq/L (ref 19–32)
Calcium: 9.9 mg/dL (ref 8.4–10.5)
Chloride: 102 meq/L (ref 96–112)
Creatinine, Ser: 1.17 mg/dL (ref 0.40–1.50)
GFR: 74.81 mL/min (ref >60.00)
Glucose, Bld: 166 mg/dL — ABNORMAL HIGH (ref 70–99)
Potassium: 4 meq/L (ref 3.5–5.1)
Sodium: 137 meq/L (ref 135–145)

## 2024-02-02 LAB — HEMOGLOBIN A1C: Hgb A1c MFr Bld: 6.9 % — ABNORMAL HIGH (ref 4.6–6.5)

## 2024-02-02 LAB — MICROALBUMIN / CREATININE URINE RATIO
Creatinine,U: 397.5 mg/dL
Microalb Creat Ratio: 4.1 mg/g (ref 0.0–30.0)
Microalb, Ur: 1.6 mg/dL (ref 0.0–1.9)

## 2024-02-02 NOTE — Progress Notes (Signed)
 Subjective:  Patient ID: Anthony Nielsen, male    DOB: 06-21-1976  Age: 48 y.o. MRN: [id number]  CC: Hypertension and Diabetes   HPI Anthony Nielsen presents for f/up ----  Discussed the use of AI scribe software for clinical note transcription with the patient, who gave verbal consent to proceed.  History of Present Illness   Anthony Nielsen "Anthony Nielsen" is a 48 year old male with hypertension who presents with concerns about low blood pressure.  He has concerns about low blood pressure, with readings typically around 105/75 mmHg. No fatigue, lightheadedness, recent illness, dehydration, nausea, vomiting, diarrhea, or cramping. No trouble breathing, stomach pain, or pain when walking.  He is currently taking Darbichlor for blood pressure management. He has not picked up Zepbound due to insurance issues. He uses melatonin and Advil PM at night for sleep but is unsure of his impact on blood pressure.  He smokes cigarettes and consumes alcohol once or twice a month, with the last intake a couple of weeks ago. He is resuming exercise due to good weather and feels great during physical activity.  He has not had recent flu or pneumonia vaccines but received his second COVID vaccine.       Outpatient Medications Prior to Visit  Medication Sig Dispense Refill   rosuvastatin (CRESTOR) 20 MG tablet TAKE 1 TABLET(20 MG) BY MOUTH DAILY 90 tablet 0   EDARBYCLOR 40-12.5 MG TABS TAKE 1 TABLET BY MOUTH DAILY 90 tablet 1   omega-3 acid ethyl esters (LOVAZA) 1 g capsule TAKE 2 CAPSULES BY MOUTH TWICE DAILY 360 capsule 0   omega-3 acid ethyl esters (LOVAZA) 1 g capsule Take 2 capsules (2 g total) by mouth 2 (two) times daily. 360 capsule 0   rosuvastatin (CRESTOR) 20 MG tablet TAKE 1 TABLET(20 MG) BY MOUTH DAILY 90 tablet 0   PEG 3350-KCl-NaCl-NaSulf-MgSul (SUFLAVE) 178.7 g SOLR Take 1 kit by mouth as directed. 1 each 0   tirzepatide (ZEPBOUND) 2.5 MG/0.5ML Pen Inject 2.5 mg into the skin once a  week. (Patient not taking: Reported on 02/02/2024) 4 mL 0   No facility-administered medications prior to visit.    ROS Review of Systems  Constitutional:  Positive for unexpected weight change (wt gain). Negative for appetite change, chills, diaphoresis and fatigue.  HENT: Negative.    Eyes: Negative.   Respiratory: Negative.  Negative for apnea, choking, shortness of breath and wheezing.   Cardiovascular:  Negative for chest pain, palpitations and leg swelling.  Gastrointestinal: Negative.  Negative for abdominal pain, constipation, diarrhea, nausea and vomiting.  Genitourinary: Negative.  Negative for difficulty urinating.  Musculoskeletal: Negative.  Negative for arthralgias, joint swelling and myalgias.  Skin: Negative.   Neurological: Negative.  Negative for dizziness and weakness.  Hematological:  Negative for adenopathy. Does not bruise/bleed easily.  Psychiatric/Behavioral: Negative.      Objective:  BP 104/72 (BP Location: Left Arm, Patient Position: Sitting, Cuff Size: Normal)   Pulse 91   Temp 97.9 F (36.6 C) (Oral)   Ht 6' (1.829 m)   Wt 275 lb 9.6 oz (125 kg)   SpO2 96%   BMI 37.38 kg/m   BP Readings from Last 3 Encounters:  02/02/24 104/72  12/14/23 97/67  11/08/23 (!) 156/98    Wt Readings from Last 3 Encounters:  02/02/24 275 lb 9.6 oz (125 kg)  12/14/23 273 lb (123.8 kg)  12/06/23 265 lb (120.2 kg)    Physical Exam Vitals reviewed.  Constitutional:  Appearance: He is obese.  HENT:     Nose: Nose normal.     Mouth/Throat:     Mouth: Mucous membranes are moist.  Eyes:     General: No scleral icterus.    Conjunctiva/sclera: Conjunctivae normal.  Cardiovascular:     Rate and Rhythm: Normal rate and regular rhythm.     Heart sounds: No murmur heard.    No friction rub. No gallop.  Pulmonary:     Effort: Pulmonary effort is normal.     Breath sounds: No stridor. No wheezing, rhonchi or rales.  Abdominal:     General: Abdomen is  protuberant. Bowel sounds are normal. There is no distension.     Palpations: Abdomen is soft. There is no hepatomegaly, splenomegaly or mass.     Tenderness: There is no abdominal tenderness.  Musculoskeletal:        General: Normal range of motion.     Cervical back: Neck supple.     Right lower leg: No edema.     Left lower leg: No edema.  Skin:    General: Skin is warm and dry.  Neurological:     General: No focal deficit present.     Mental Status: He is alert.  Psychiatric:        Mood and Affect: Mood normal.        Behavior: Behavior normal.     Lab Results  Component Value Date   WBC 8.5 02/02/2024   HGB 15.8 02/02/2024   HCT 46.8 02/02/2024   PLT 213.0 02/02/2024   GLUCOSE 166 (H) 02/02/2024   CHOL 319 (H) 11/08/2023   TRIG (H) 11/08/2023    488.0 Triglyceride is over 400; calculations on Lipids are invalid.   HDL 37.70 (L) 11/08/2023   LDLDIRECT 190.0 11/08/2023   LDLCALC 119 (H) 05/08/2022   ALT 86 (H) 02/02/2024   AST 42 (H) 02/02/2024   NA 137 02/02/2024   K 4.0 02/02/2024   CL 102 02/02/2024   CREATININE 1.17 02/02/2024   BUN 24 (H) 02/02/2024   CO2 26 02/02/2024   TSH 2.50 11/08/2023   PSA 0.16 01/01/2022   INR 1.0 11/08/2023   HGBA1C 6.9 (H) 02/02/2024   MICROALBUR 1.6 02/02/2024    No results found.  Assessment & Plan:   Hypertension, unspecified type- His BP is over-controlled. He will discontinue edarbyclor. -     CBC with Differential/Platelet; Future -     Basic metabolic panel with GFR; Future -     Urinalysis, Routine w reflex microscopic; Future  Essential familial hypercholesterolemia -     Hepatic function panel; Future  NAFLD (nonalcoholic fatty liver disease) -     Hepatic function panel; Future  Type 2 diabetes mellitus without complication, without long-term current use of insulin (HCC)- Will start a GLP-1/GIP antagonist. -     Basic metabolic panel with GFR; Future -     Microalbumin / creatinine urine ratio; Future -      Urinalysis, Routine w reflex microscopic; Future -     Hemoglobin A1c; Future -     HM Diabetes Foot Exam -     Tirzepatide; Inject 2.5 mg into the skin once a week.  Dispense: 2 mL; Refill: 0  Idiopathic hypotension -     CBC with Differential/Platelet; Future -     Basic metabolic panel with GFR; Future -     Cortisol; Future  Hypertriglyceridemia, essential -     Omega-3-acid Ethyl Esters; Take 2 capsules (  2 g total) by mouth 2 (two) times daily.  Dispense: 360 capsule; Refill: 0     Follow-up: Return in about 3 months (around 05/04/2024).  Sanda Linger, MD

## 2024-02-02 NOTE — Patient Instructions (Signed)
 Hypertension, Adult High blood pressure (hypertension) is when the force of blood pumping through the arteries is too strong. The arteries are the blood vessels that carry blood from the heart throughout the body. Hypertension forces the heart to work harder to pump blood and may cause arteries to become narrow or stiff. Untreated or uncontrolled hypertension can lead to a heart attack, heart failure, a stroke, kidney disease, and other problems. A blood pressure reading consists of a higher number over a lower number. Ideally, your blood pressure should be below 120/80. The first ("top") number is called the systolic pressure. It is a measure of the pressure in your arteries as your heart beats. The second ("bottom") number is called the diastolic pressure. It is a measure of the pressure in your arteries as the heart relaxes. What are the causes? The exact cause of this condition is not known. There are some conditions that result in high blood pressure. What increases the risk? Certain factors may make you more likely to develop high blood pressure. Some of these risk factors are under your control, including: Smoking. Not getting enough exercise or physical activity. Being overweight. Having too much fat, sugar, calories, or salt (sodium) in your diet. Drinking too much alcohol. Other risk factors include: Having a personal history of heart disease, diabetes, high cholesterol, or kidney disease. Stress. Having a family history of high blood pressure and high cholesterol. Having obstructive sleep apnea. Age. The risk increases with age. What are the signs or symptoms? High blood pressure may not cause symptoms. Very high blood pressure (hypertensive crisis) may cause: Headache. Fast or irregular heartbeats (palpitations). Shortness of breath. Nosebleed. Nausea and vomiting. Vision changes. Severe chest pain, dizziness, and seizures. How is this diagnosed? This condition is diagnosed by  measuring your blood pressure while you are seated, with your arm resting on a flat surface, your legs uncrossed, and your feet flat on the floor. The cuff of the blood pressure monitor will be placed directly against the skin of your upper arm at the level of your heart. Blood pressure should be measured at least twice using the same arm. Certain conditions can cause a difference in blood pressure between your right and left arms. If you have a high blood pressure reading during one visit or you have normal blood pressure with other risk factors, you may be asked to: Return on a different day to have your blood pressure checked again. Monitor your blood pressure at home for 1 week or longer. If you are diagnosed with hypertension, you may have other blood or imaging tests to help your health care provider understand your overall risk for other conditions. How is this treated? This condition is treated by making healthy lifestyle changes, such as eating healthy foods, exercising more, and reducing your alcohol intake. You may be referred for counseling on a healthy diet and physical activity. Your health care provider may prescribe medicine if lifestyle changes are not enough to get your blood pressure under control and if: Your systolic blood pressure is above 130. Your diastolic blood pressure is above 80. Your personal target blood pressure may vary depending on your medical conditions, your age, and other factors. Follow these instructions at home: Eating and drinking  Eat a diet that is high in fiber and potassium, and low in sodium, added sugar, and fat. An example of this eating plan is called the DASH diet. DASH stands for Dietary Approaches to Stop Hypertension. To eat this way: Eat  plenty of fresh fruits and vegetables. Try to fill one half of your plate at each meal with fruits and vegetables. Eat whole grains, such as whole-wheat pasta, brown rice, or whole-grain bread. Fill about one  fourth of your plate with whole grains. Eat or drink low-fat dairy products, such as skim milk or low-fat yogurt. Avoid fatty cuts of meat, processed or cured meats, and poultry with skin. Fill about one fourth of your plate with lean proteins, such as fish, chicken without skin, beans, eggs, or tofu. Avoid pre-made and processed foods. These tend to be higher in sodium, added sugar, and fat. Reduce your daily sodium intake. Many people with hypertension should eat less than 1,500 mg of sodium a day. Do not drink alcohol if: Your health care provider tells you not to drink. You are pregnant, may be pregnant, or are planning to become pregnant. If you drink alcohol: Limit how much you have to: 0-1 drink a day for women. 0-2 drinks a day for men. Know how much alcohol is in your drink. In the [place] one drink equals one 12 oz bottle of beer (355 mL), one 5 oz glass of wine (148 mL), or one 1 oz glass of hard liquor (44 mL). Lifestyle  Work with your health care provider to maintain a healthy body weight or to lose weight. Ask what an ideal weight is for you. Get at least 30 minutes of exercise that causes your heart to beat faster (aerobic exercise) most days of the week. Activities may include walking, swimming, or biking. Include exercise to strengthen your muscles (resistance exercise), such as Pilates or lifting weights, as part of your weekly exercise routine. Try to do these types of exercises for 30 minutes at least 3 days a week. Do not use any products that contain nicotine or tobacco. These products include cigarettes, chewing tobacco, and vaping devices, such as e-cigarettes. If you need help quitting, ask your health care provider. Monitor your blood pressure at home as told by your health care provider. Keep all follow-up visits. This is important. Medicines Take over-the-counter and prescription medicines only as told by your health care provider. Follow directions carefully. Blood  pressure medicines must be taken as prescribed. Do not skip doses of blood pressure medicine. Doing this puts you at risk for problems and can make the medicine less effective. Ask your health care provider about side effects or reactions to medicines that you should watch for. Contact a health care provider if you: Think you are having a reaction to a medicine you are taking. Have headaches that keep coming back (recurring). Feel dizzy. Have swelling in your ankles. Have trouble with your vision. Get help right away if you: Develop a severe headache or confusion. Have unusual weakness or numbness. Feel faint. Have severe pain in your chest or abdomen. Vomit repeatedly. Have trouble breathing. These symptoms may be an emergency. Get help right away. Call 911. Do not wait to see if the symptoms will go away. Do not drive yourself to the hospital. Summary Hypertension is when the force of blood pumping through your arteries is too strong. If this condition is not controlled, it may put you at risk for serious complications. Your personal target blood pressure may vary depending on your medical conditions, your age, and other factors. For most people, a normal blood pressure is less than 120/80. Hypertension is treated with lifestyle changes, medicines, or a combination of both. Lifestyle changes include losing weight, eating a healthy,  low-sodium diet, exercising more, and limiting alcohol. This information is not intended to replace advice given to you by your health care provider. Make sure you discuss any questions you have with your health care provider. Document Revised: 09/01/2021 Document Reviewed: 09/01/2021 Elsevier Patient Education  2024 [employer].

## 2024-02-03 LAB — CORTISOL: Cortisol, Plasma: 9.1 ug/dL

## 2024-02-03 MED ORDER — TIRZEPATIDE 2.5 MG/0.5ML ~~LOC~~ SOAJ: 2.5000 mg | SUBCUTANEOUS | 0 refills | Status: DC

## 2024-02-05 ENCOUNTER — Encounter: Payer: Self-pay | Admitting: Internal Medicine

## 2024-02-06 MED ORDER — OMEGA-3-ACID ETHYL ESTERS 1 G PO CAPS
2.0000 | ORAL_CAPSULE | Freq: Two times a day (BID) | ORAL | 0 refills | Status: DC
Start: 2024-02-06 — End: 2024-05-09

## 2024-02-07 ENCOUNTER — Other Ambulatory Visit (HOSPITAL_COMMUNITY): Payer: Self-pay

## 2024-02-27 ENCOUNTER — Ambulatory Visit: Admitting: Internal Medicine

## 2024-03-21 ENCOUNTER — Other Ambulatory Visit: Payer: Self-pay | Admitting: Internal Medicine

## 2024-03-21 DIAGNOSIS — F5104 Psychophysiologic insomnia: Secondary | ICD-10-CM

## 2024-03-21 DIAGNOSIS — F331 Major depressive disorder, recurrent, moderate: Secondary | ICD-10-CM

## 2024-03-21 DIAGNOSIS — F410 Panic disorder [episodic paroxysmal anxiety] without agoraphobia: Secondary | ICD-10-CM | POA: Insufficient documentation

## 2024-03-21 MED ORDER — ESCITALOPRAM OXALATE 5 MG PO TABS: 5.0000 mg | ORAL_TABLET | Freq: Every day | ORAL | 0 refills | Status: DC

## 2024-03-21 MED ORDER — TRAZODONE HCL 50 MG PO TABS: 50.0000 mg | ORAL_TABLET | Freq: Every day | ORAL | 0 refills | Status: DC

## 2024-04-16 ENCOUNTER — Other Ambulatory Visit: Payer: Self-pay | Admitting: Internal Medicine

## 2024-04-16 DIAGNOSIS — F331 Major depressive disorder, recurrent, moderate: Secondary | ICD-10-CM

## 2024-04-16 DIAGNOSIS — F5104 Psychophysiologic insomnia: Secondary | ICD-10-CM

## 2024-04-16 DIAGNOSIS — F410 Panic disorder [episodic paroxysmal anxiety] without agoraphobia: Secondary | ICD-10-CM

## 2024-05-07 ENCOUNTER — Other Ambulatory Visit: Payer: Self-pay | Admitting: Internal Medicine

## 2024-05-07 DIAGNOSIS — E7801 Familial hypercholesterolemia: Secondary | ICD-10-CM

## 2024-05-08 ENCOUNTER — Other Ambulatory Visit: Payer: Self-pay | Admitting: Internal Medicine

## 2024-05-08 DIAGNOSIS — E781 Pure hyperglyceridemia: Secondary | ICD-10-CM

## 2024-05-09 ENCOUNTER — Other Ambulatory Visit: Payer: Self-pay | Admitting: Internal Medicine

## 2024-05-09 DIAGNOSIS — I10 Essential (primary) hypertension: Secondary | ICD-10-CM

## 2024-07-19 ENCOUNTER — Other Ambulatory Visit: Payer: Self-pay | Admitting: Student

## 2024-07-19 DIAGNOSIS — M5416 Radiculopathy, lumbar region: Secondary | ICD-10-CM

## 2024-07-20 ENCOUNTER — Other Ambulatory Visit: Payer: Self-pay | Admitting: Medical Genetics

## 2024-07-20 ENCOUNTER — Encounter: Payer: Self-pay | Admitting: Student

## 2024-08-29 ENCOUNTER — Telehealth: Payer: Self-pay

## 2024-08-29 NOTE — Telephone Encounter (Signed)
 Copied from CRM [id number]. Topic: Clinical - Medical Advice >> Aug 29, 2024 11:37 AM Rea ORN wrote: Reason for CRM: Kyla with [employer], called to advise they received a sample from pt but they do not have an active order from PCP. Pt collected the sample within someone else's kit and sent it into [facility]. Please advise if new order will be sent. Can be sent electronically or via fax [phone]. They will hold the sample until they hear from clinic.

## 2024-08-31 NOTE — Telephone Encounter (Signed)
 Copied from CRM #[id number]. Topic: Clinical - Medical Advice >> Aug 31, 2024  3:42 PM Thersia BROCKS wrote: Reason for CRM: [facility] called in regarding patient cologuard was expired and need a new order   [phone]

## 2024-09-06 NOTE — Telephone Encounter (Unsigned)
 Copied from CRM [id number]. Topic: General - Call Back - No Documentation >> Sep 05, 2024  5:03 PM Alfonso ORN wrote: Reason for CRM: [facility]  need f/u on new order for specimen . Requesting new order  fax:[phone]

## 2024-09-07 ENCOUNTER — Other Ambulatory Visit: Payer: Self-pay | Admitting: Internal Medicine

## 2024-09-07 DIAGNOSIS — Z1211 Encounter for screening for malignant neoplasm of colon: Secondary | ICD-10-CM

## 2024-09-07 NOTE — Telephone Encounter (Signed)
 Can you write a new order for me to fax?

## 2024-09-13 LAB — COLOGUARD: COLOGUARD: NEGATIVE

## 2024-10-03 ENCOUNTER — Other Ambulatory Visit (HOSPITAL_COMMUNITY)

## 2024-10-19 ENCOUNTER — Other Ambulatory Visit: Payer: Self-pay | Admitting: Internal Medicine

## 2024-10-19 DIAGNOSIS — E781 Pure hyperglyceridemia: Secondary | ICD-10-CM

## 2024-10-28 ENCOUNTER — Other Ambulatory Visit: Payer: Self-pay

## 2024-10-28 ENCOUNTER — Emergency Department (HOSPITAL_COMMUNITY): Payer: Self-pay

## 2024-10-28 ENCOUNTER — Inpatient Hospital Stay (HOSPITAL_COMMUNITY)
Admission: EM | Admit: 2024-10-28 | Discharge: 2024-10-30 | DRG: 871 | Disposition: A | Discharge status: Home / Self Care

## 2024-10-28 DIAGNOSIS — Z800 Family history of malignant neoplasm of digestive organs: Secondary | ICD-10-CM

## 2024-10-28 DIAGNOSIS — I272 Pulmonary hypertension, unspecified: Secondary | ICD-10-CM | POA: Diagnosis present

## 2024-10-28 DIAGNOSIS — F17210 Nicotine dependence, cigarettes, uncomplicated: Secondary | ICD-10-CM | POA: Diagnosis present

## 2024-10-28 DIAGNOSIS — I10 Essential (primary) hypertension: Secondary | ICD-10-CM | POA: Diagnosis present

## 2024-10-28 DIAGNOSIS — E781 Pure hyperglyceridemia: Secondary | ICD-10-CM | POA: Diagnosis present

## 2024-10-28 DIAGNOSIS — J189 Pneumonia, unspecified organism: Principal | ICD-10-CM | POA: Diagnosis present

## 2024-10-28 DIAGNOSIS — Z8249 Family history of ischemic heart disease and other diseases of the circulatory system: Secondary | ICD-10-CM | POA: Diagnosis not present

## 2024-10-28 DIAGNOSIS — E872 Acidosis, unspecified: Secondary | ICD-10-CM | POA: Diagnosis present

## 2024-10-28 DIAGNOSIS — R748 Abnormal levels of other serum enzymes: Secondary | ICD-10-CM

## 2024-10-28 DIAGNOSIS — F410 Panic disorder [episodic paroxysmal anxiety] without agoraphobia: Secondary | ICD-10-CM | POA: Diagnosis present

## 2024-10-28 DIAGNOSIS — K760 Fatty (change of) liver, not elsewhere classified: Secondary | ICD-10-CM | POA: Diagnosis present

## 2024-10-28 DIAGNOSIS — A419 Sepsis, unspecified organism: Principal | ICD-10-CM | POA: Diagnosis present

## 2024-10-28 DIAGNOSIS — F331 Major depressive disorder, recurrent, moderate: Secondary | ICD-10-CM | POA: Diagnosis present

## 2024-10-28 DIAGNOSIS — E119 Type 2 diabetes mellitus without complications: Secondary | ICD-10-CM | POA: Diagnosis present

## 2024-10-28 DIAGNOSIS — Z1152 Encounter for screening for COVID-19: Secondary | ICD-10-CM | POA: Diagnosis not present

## 2024-10-28 LAB — COMPREHENSIVE METABOLIC PANEL WITH GFR
ALT: 45 U/L — ABNORMAL HIGH (ref 0–44)
AST: 26 U/L (ref 15–41)
Albumin: 3.9 g/dL (ref 3.5–5.0)
Alkaline Phosphatase: 52 U/L (ref 38–126)
Anion gap: 14 (ref 5–15)
BUN: 19 mg/dL (ref 6–20)
CO2: 15 mmol/L — ABNORMAL LOW (ref 22–32)
Calcium: 7.3 mg/dL — ABNORMAL LOW (ref 8.9–10.3)
Chloride: 108 mmol/L (ref 98–111)
Creatinine, Ser: 0.91 mg/dL (ref 0.61–1.24)
GFR, Estimated: >60 mL/min
Glucose, Bld: 153 mg/dL — ABNORMAL HIGH (ref 70–99)
Potassium: 3.3 mmol/L — ABNORMAL LOW (ref 3.5–5.1)
Sodium: 137 mmol/L (ref 135–145)
Total Bilirubin: 0.2 mg/dL (ref 0.0–1.2)
Total Protein: 5.9 g/dL — ABNORMAL LOW (ref 6.5–8.1)

## 2024-10-28 LAB — CBC
HCT: 48.7 % (ref 39.0–52.0)
Hemoglobin: 16.8 g/dL (ref 13.0–17.0)
MCH: 31.9 pg (ref 26.0–34.0)
MCHC: 34.5 g/dL (ref 30.0–36.0)
MCV: 92.4 fL (ref 80.0–100.0)
Platelets: 319 K/uL (ref 150–400)
RBC: 5.27 MIL/uL (ref 4.22–5.81)
RDW: 13.4 % (ref 11.5–15.5)
WBC: 14.7 K/uL — ABNORMAL HIGH (ref 4.0–10.5)
nRBC: 0 % (ref 0.0–0.2)

## 2024-10-28 LAB — CBG MONITORING, ED
Glucose-Capillary: 173 mg/dL — ABNORMAL HIGH (ref 70–99)
Glucose-Capillary: 134 mg/dL — ABNORMAL HIGH (ref 70–99)

## 2024-10-28 LAB — URINALYSIS, ROUTINE W REFLEX MICROSCOPIC
Bilirubin Urine: NEGATIVE
Glucose, UA: NEGATIVE mg/dL
Hgb urine dipstick: NEGATIVE
Ketones, ur: NEGATIVE mg/dL
Leukocytes,Ua: NEGATIVE
Nitrite: NEGATIVE
Protein, ur: NEGATIVE mg/dL
Specific Gravity, Urine: <1.005 — ABNORMAL LOW (ref 1.005–1.030)
pH: 5.5 (ref 5.0–8.0)

## 2024-10-28 LAB — I-STAT CG4 LACTIC ACID, ED
Lactic Acid, Venous: 4.4 mmol/L (ref 0.5–1.9)
Lactic Acid, Venous: 4.7 mmol/L (ref 0.5–1.9)
Lactic Acid, Venous: 1.2 mmol/L (ref 0.5–1.9)

## 2024-10-28 LAB — HIV ANTIBODY (ROUTINE TESTING W REFLEX): HIV Screen 4th Generation wRfx: NONREACTIVE

## 2024-10-28 LAB — RESP PANEL BY RT-PCR (RSV, FLU A&B, COVID)  RVPGX2
Influenza A by PCR: NEGATIVE
Influenza B by PCR: NEGATIVE
Resp Syncytial Virus by PCR: NEGATIVE
SARS Coronavirus 2 by RT PCR: NEGATIVE

## 2024-10-28 LAB — LIPASE, BLOOD: Lipase: 148 U/L — ABNORMAL HIGH (ref 11–51)

## 2024-10-28 LAB — PROCALCITONIN: Procalcitonin: <0.1 ng/mL

## 2024-10-28 LAB — TROPONIN T, HIGH SENSITIVITY: Troponin T High Sensitivity: <15 ng/L (ref 0–19)

## 2024-10-28 MED ORDER — ONDANSETRON HCL 4 MG/2ML IJ SOLN
4.0000 mg | Freq: Once | INTRAMUSCULAR | Status: AC
  Administered 2024-10-28: 4 mg via INTRAVENOUS
  Filled 2024-10-28: qty 2

## 2024-10-28 MED ORDER — ASPIRIN 81 MG PO CHEW
324.0000 mg | CHEWABLE_TABLET | Freq: Once | ORAL | Status: AC
  Administered 2024-10-28: 324 mg via ORAL
  Filled 2024-10-28: qty 4

## 2024-10-28 MED ORDER — LACTATED RINGERS IV BOLUS (SEPSIS)
2000.0000 mL | Freq: Once | INTRAVENOUS | Status: AC
  Administered 2024-10-28: 2000 mL via INTRAVENOUS

## 2024-10-28 MED ORDER — LACTATED RINGERS IV BOLUS
1000.0000 mL | Freq: Once | INTRAVENOUS | Status: AC
  Administered 2024-10-28: 1000 mL via INTRAVENOUS

## 2024-10-28 MED ORDER — PANTOPRAZOLE SODIUM 40 MG IV SOLR
40.0000 mg | Freq: Once | INTRAVENOUS | Status: AC
  Administered 2024-10-28: 40 mg via INTRAVENOUS
  Filled 2024-10-28: qty 10

## 2024-10-28 MED ORDER — SODIUM CHLORIDE 0.9 % IV SOLN
500.0000 mg | INTRAVENOUS | Status: DC
  Administered 2024-10-28 – 2024-10-29 (×2): 500 mg via INTRAVENOUS
  Filled 2024-10-28 (×3): qty 5

## 2024-10-28 MED ORDER — VANCOMYCIN HCL 1500 MG/300ML IV SOLN
1500.0000 mg | Freq: Two times a day (BID) | INTRAVENOUS | Status: DC
  Administered 2024-10-29 (×2): 1500 mg via INTRAVENOUS
  Filled 2024-10-28 (×2): qty 300

## 2024-10-28 MED ORDER — SODIUM CHLORIDE 0.9 % IV SOLN
2.0000 g | Freq: Three times a day (TID) | INTRAVENOUS | Status: DC
  Administered 2024-10-28 – 2024-10-29 (×3): 2 g via INTRAVENOUS
  Filled 2024-10-28 (×3): qty 12.5

## 2024-10-28 MED ORDER — ENOXAPARIN SODIUM 40 MG/0.4ML IJ SOSY
40.0000 mg | PREFILLED_SYRINGE | INTRAMUSCULAR | Status: DC
  Administered 2024-10-28 – 2024-10-29 (×2): 40 mg via SUBCUTANEOUS
  Filled 2024-10-28 (×2): qty 0.4

## 2024-10-28 MED ORDER — POLYETHYLENE GLYCOL 3350 17 G PO PACK: 17.0000 g | PACK | Freq: Every day | ORAL | Status: DC | PRN

## 2024-10-28 MED ORDER — MORPHINE SULFATE (PF) 4 MG/ML IV SOLN
4.0000 mg | Freq: Once | INTRAVENOUS | Status: AC
  Administered 2024-10-28: 4 mg via INTRAVENOUS
  Filled 2024-10-28: qty 1

## 2024-10-28 MED ORDER — ALPRAZOLAM 0.5 MG PO TABS
0.5000 mg | ORAL_TABLET | Freq: Two times a day (BID) | ORAL | Status: DC | PRN
  Administered 2024-10-28 – 2024-10-30 (×4): 0.5 mg via ORAL
  Filled 2024-10-28 (×2): qty 2
  Filled 2024-10-28 (×2): qty 1

## 2024-10-28 MED ORDER — LACTATED RINGERS IV SOLN: INTRAVENOUS | Status: AC

## 2024-10-28 MED ORDER — SODIUM CHLORIDE 0.9 % IV SOLN
2.0000 g | Freq: Once | INTRAVENOUS | Status: AC
  Administered 2024-10-28: 2 g via INTRAVENOUS
  Filled 2024-10-28: qty 12.5

## 2024-10-28 MED ORDER — METRONIDAZOLE 500 MG/100ML IV SOLN
500.0000 mg | Freq: Two times a day (BID) | INTRAVENOUS | Status: DC
  Administered 2024-10-29 (×2): 500 mg via INTRAVENOUS
  Filled 2024-10-28 (×2): qty 100

## 2024-10-28 MED ORDER — METRONIDAZOLE 500 MG/100ML IV SOLN
500.0000 mg | Freq: Once | INTRAVENOUS | Status: AC
  Administered 2024-10-28: 500 mg via INTRAVENOUS
  Filled 2024-10-28: qty 100

## 2024-10-28 MED ORDER — ACETAMINOPHEN 325 MG PO TABS
650.0000 mg | ORAL_TABLET | Freq: Four times a day (QID) | ORAL | Status: DC | PRN
  Administered 2024-10-28 – 2024-10-29 (×2): 650 mg via ORAL
  Filled 2024-10-28 (×2): qty 2

## 2024-10-28 MED ORDER — VANCOMYCIN HCL 2000 MG/400ML IV SOLN
2000.0000 mg | Freq: Once | INTRAVENOUS | Status: AC
  Administered 2024-10-28: 2000 mg via INTRAVENOUS
  Filled 2024-10-28: qty 400

## 2024-10-28 MED ORDER — ACETAMINOPHEN 650 MG RE SUPP: 650.0000 mg | Freq: Four times a day (QID) | RECTAL | Status: DC | PRN

## 2024-10-28 MED ORDER — SODIUM CHLORIDE 0.9% FLUSH
3.0000 mL | Freq: Two times a day (BID) | INTRAVENOUS | Status: DC
  Administered 2024-10-28 – 2024-10-30 (×4): 3 mL via INTRAVENOUS

## 2024-10-28 MED ORDER — ONDANSETRON HCL 4 MG/2ML IJ SOLN
4.0000 mg | Freq: Four times a day (QID) | INTRAMUSCULAR | Status: DC | PRN
  Administered 2024-10-28: 4 mg via INTRAVENOUS
  Filled 2024-10-28: qty 2

## 2024-10-28 MED ORDER — INSULIN ASPART 100 UNIT/ML IJ SOLN
0.0000 [IU] | Freq: Three times a day (TID) | INTRAMUSCULAR | Status: DC
  Administered 2024-10-28: 2 [IU] via SUBCUTANEOUS
  Filled 2024-10-28: qty 2

## 2024-10-28 MED ORDER — IOHEXOL 350 MG/ML SOLN
75.0000 mL | Freq: Once | INTRAVENOUS | Status: AC | PRN
  Administered 2024-10-28: 75 mL via INTRAVENOUS

## 2024-10-28 NOTE — Sepsis Progress Note (Signed)
 Elink following code sepsis

## 2024-10-28 NOTE — ED Notes (Signed)
 Phlebotomy at [facility] collecting cultures

## 2024-10-28 NOTE — ED Notes (Addendum)
 Bolus ordered for BP

## 2024-10-28 NOTE — ED Notes (Signed)
 I attempted to stick for blood cultures; unsuccessful; Phlebotomy made aware

## 2024-10-28 NOTE — ED Notes (Signed)
 EDP at [facility]

## 2024-10-28 NOTE — ED Provider Notes (Signed)
 " [place] [facility] AT [facility] Provider Note   CSN: [id number] Arrival date & time: 10/28/24  1025     Patient presents with: Chest Pain and Shortness of Breath   Anthony Nielsen is a 48 y.o. male.    Chest Pain Associated symptoms: shortness of breath   Shortness of Breath Associated symptoms: chest pain      Pt woke up with severe chest pain.  Pt states she woke up with severe pain this am.  He feels a pressure in his chest.  He denies history of heart disease.  Patient states he feels like there is an elephant sitting on his chest.  He denies having abdominal pain but has had some nausea and vomiting.  He does have a history of alcohol use and does drink alcohol daily.  Patient also was recently exposed to a family member who had pneumonia   Prior to Admission medications  Medication Sig Start Date End Date Taking? Authorizing Provider  escitalopram  (LEXAPRO ) 5 MG tablet TAKE 1 TABLET(5 MG) BY MOUTH DAILY 04/18/24   Joshua Debby CROME, MD  omega-3 acid ethyl esters (LOVAZA ) 1 g capsule Take 2 capsules (2 g total) by mouth 2 (two) times daily. NEEDS FOLLOW UP VISIT FOR REFILLS 05/09/24   Joshua Debby CROME, MD  rosuvastatin  (CRESTOR ) 20 MG tablet TAKE 1 TABLET(20 MG) BY MOUTH DAILY. Patient to follow up with PCP prior to future refills 05/07/24   Joshua Debby CROME, MD  tirzepatide  (MOUNJARO ) 2.5 MG/0.5ML Pen Inject 2.5 mg into the skin once a week. 02/03/24   Joshua Debby CROME, MD  traZODone  (DESYREL ) 50 MG tablet TAKE 1 TABLET(50 MG) BY MOUTH AT BEDTIME 04/18/24   Joshua Debby CROME, MD    Allergies: Patient has no known allergies.    Review of Systems  Respiratory:  Positive for shortness of breath.   Cardiovascular:  Positive for chest pain.    Updated Vital Signs BP 102/73   Pulse 100   Temp 98.4 F (36.9 C)   Resp 15   SpO2 96%   Physical Exam Vitals and nursing note reviewed.  Constitutional:      Appearance: He is well-developed. He is  ill-appearing.  HENT:     Head: Normocephalic and atraumatic.     Right Ear: External ear normal.     Left Ear: External ear normal.  Eyes:     General: No scleral icterus.       Right eye: No discharge.        Left eye: No discharge.     Conjunctiva/sclera: Conjunctivae normal.  Neck:     Trachea: No tracheal deviation.  Cardiovascular:     Rate and Rhythm: Normal rate and regular rhythm.  Pulmonary:     Effort: Pulmonary effort is normal. No respiratory distress.     Breath sounds: Normal breath sounds. No stridor. No wheezing or rales.  Abdominal:     General: Bowel sounds are normal. There is no distension.     Palpations: Abdomen is soft.     Tenderness: There is no abdominal tenderness. There is no guarding or rebound.  Musculoskeletal:        General: No tenderness or deformity.     Cervical back: Neck supple.  Skin:    General: Skin is warm and dry.     Findings: No rash.  Neurological:     General: No focal deficit present.     Mental Status: He is alert.  Cranial Nerves: No cranial nerve deficit, dysarthria or facial asymmetry.     Sensory: No sensory deficit.     Motor: No abnormal muscle tone or seizure activity.     Coordination: Coordination normal.  Psychiatric:        Mood and Affect: Mood normal.     (all labs ordered are listed, but only abnormal results are displayed) Labs Reviewed  CBC - Abnormal; Notable for the following components:      Result Value   WBC 14.7 (*)    All other components within normal limits  COMPREHENSIVE METABOLIC PANEL WITH GFR - Abnormal; Notable for the following components:   Potassium 3.3 (*)    CO2 15 (*)    Glucose, Bld 153 (*)    Calcium  7.3 (*)    Total Protein 5.9 (*)    ALT 45 (*)    All other components within normal limits  LIPASE, BLOOD - Abnormal; Notable for the following components:   Lipase 148 (*)    All other components within normal limits  I-STAT CG4 LACTIC ACID, ED - Abnormal; Notable for the  following components:   Lactic Acid, Venous 4.4 (*)    All other components within normal limits  CULTURE, BLOOD (ROUTINE X 2)  CULTURE, BLOOD (ROUTINE X 2)  I-STAT CG4 LACTIC ACID, ED  TROPONIN T, HIGH SENSITIVITY  TROPONIN T, HIGH SENSITIVITY    EKG: EKG Interpretation Date/Time:  Sunday October 28 2024 10:39:28 EST Ventricular Rate:  100 PR Interval:  163 QRS Duration:  90 QT Interval:  344 QTC Calculation: 444 R Axis:   80  Text Interpretation: Sinus tachycardia ST elev, probable normal early repol pattern , new since last tracing Confirmed by Randol Simmonds [phone]) on 10/28/2024 10:45:01 AM  Radiology: CT ABDOMEN PELVIS W CONTRAST Result Date: 10/28/2024 CLINICAL DATA:  Provided history: chest pain, elevated lipase, heavy alcohol use history EXAM: CT ABDOMEN AND PELVIS WITH CONTRAST TECHNIQUE: Multidetector CT imaging of the abdomen and pelvis was performed using the standard protocol following bolus administration of intravenous contrast. RADIATION DOSE REDUCTION: This exam was performed according to the departmental dose-optimization program which includes automated exposure control, adjustment of the mA and/or kV according to patient size and/or use of iterative reconstruction technique. CONTRAST:  75mL OMNIPAQUE  IOHEXOL  350 MG/ML SOLN COMPARISON:  CT 01/21/2022.  MRI 03/01/2022 FINDINGS: Lower chest: Assessed on concurrent chest CT, reported separately. Hepatobiliary: Diffuse hepatic steatosis. No focal liver lesion. Gallbladder physiologically distended, no calcified stone. No biliary dilatation. Pancreas: No fat stranding or inflammatory change about the pancreas. No ductal dilatation or pancreatic mass. Spleen: Normal in size without focal abnormality. Adrenals/Urinary Tract: Stable a 2.8 cm right adrenal nodule, previously characterized as adenoma. Normal left adrenal gland. No hydronephrosis or renal calculi. Mild symmetric bilateral perinephric stranding. No suspicious renal  lesion. Unremarkable urinary bladder. Stomach/Bowel: Decompressed stomach. No small bowel obstruction or inflammation. Normal appendix. Wall thickening versus nondistention of the ascending colon. Small volume of formed colonic stool. Vascular/Lymphatic: Mild aorto bi-iliac atherosclerosis. No aortic aneurysm. The portal vein is patent. Splenic and mesenteric veins are patent. No enlarged lymph nodes in the abdomen or pelvis. Reproductive: Prostate is unremarkable. Other: No free air, free fluid, or intra-abdominal fluid collection. Small fat containing umbilical hernia. Musculoskeletal: L5-S1 degenerative disc disease with multilevel lower lumbar facet hypertrophy. CT CT bone IMPRESSION: 1. No CT findings of acute pancreatitis. 2. Wall thickening versus nondistention of the ascending colon, can be seen with colitis. 3. Hepatic steatosis. 4.  Stable right adrenal adenoma. Aortic Atherosclerosis (ICD10-I70.0). Electronically Signed   By: Andrea Gasman M.D.   On: 10/28/2024 13:27   CT Angio Chest PE W and/or Wo Contrast Result Date: 10/28/2024 CLINICAL DATA:  Pulmonary embolism (PE) suspected, high prob Chest pain. EXAM: CT ANGIOGRAPHY CHEST WITH CONTRAST TECHNIQUE: Multidetector CT imaging of the chest was performed using the standard protocol during bolus administration of intravenous contrast. Multiplanar CT image reconstructions and MIPs were obtained to evaluate the vascular anatomy. Performed in conjunction with CT of the abdomen and pelvis. RADIATION DOSE REDUCTION: This exam was performed according to the departmental dose-optimization program which includes automated exposure control, adjustment of the mA and/or kV according to patient size and/or use of iterative reconstruction technique. CONTRAST:  75mL OMNIPAQUE  IOHEXOL  350 MG/ML SOLN COMPARISON:  Radiograph earlier today FINDINGS: Cardiovascular: There are no filling defects within the pulmonary arteries to suggest pulmonary embolus. The main  pulmonary artery is dilated at 4 cm. The heart is borderline enlarged. No acute aortic findings. Common origin of the brachiocephalic and left common carotid artery, variant arch anatomy. No pericardial effusion. Mediastinum/Nodes: No mediastinal or hilar adenopathy. Unremarkable appearance of the esophagus. 7 mm distal paraesophageal lymph node, series 6, image 113. No visible thyroid  nodule. Lungs/Pleura: Mild diffuse tree-in-bud opacities throughout both lungs. No confluent consolidation. No features of pulmonary edema. No pleural fluid. Upper Abdomen: Assessed on concurrent abdominopelvic CT, reported separately. Musculoskeletal: There are no acute or suspicious osseous abnormalities. Remote healed left rib fractures. Review of the MIP images confirms the above findings. IMPRESSION: 1. No pulmonary embolus. 2. Mild diffuse tree-in-bud opacities throughout both lungs, typical of bronchiolitis or atypical infection. 3. Dilated main pulmonary artery at 4 cm, can be seen with pulmonary arterial hypertension. Electronically Signed   By: Andrea Gasman M.D.   On: 10/28/2024 13:23   DG Chest Portable 1 View Result Date: 10/28/2024 EXAM: 1 VIEW(S) XRAY OF THE CHEST 10/28/2024 11:22:00 AM COMPARISON: None available. CLINICAL HISTORY: FINDINGS: LUNGS AND PLEURA: Low normal lung volumes. No focal pulmonary opacity. No pleural effusion. No pneumothorax. HEART AND MEDIASTINUM: No acute abnormality of the cardiac and mediastinal silhouettes. BONES AND SOFT TISSUES: Chronic left rib fractures. IMPRESSION: 1. No acute cardiopulmonary abnormality. Electronically signed by: Waddell Calk MD 10/28/2024 11:39 AM EST RP Workstation: [id number]     .Critical Care  Performed by: Randol Simmonds, MD Authorized by: Randol Simmonds, MD   Critical care provider statement:    Critical care time (minutes):  40   Critical care was time spent personally by me on the following activities:  Development of treatment plan with patient or  surrogate, discussions with consultants, evaluation of patient's response to treatment, examination of patient, ordering and review of laboratory studies, ordering and review of radiographic studies, ordering and performing treatments and interventions, pulse oximetry, re-evaluation of patient's condition and review of old charts    Medications Ordered in the ED  lactated ringers  infusion (has no administration in time range)  lactated ringers  bolus 2,000 mL (has no administration in time range)  ceFEPIme  (MAXIPIME ) 2 g in sodium chloride  0.9 % 100 mL IVPB (2 g Intravenous New Bag/Given 10/28/24 1400)  metroNIDAZOLE  (FLAGYL ) IVPB 500 mg (has no administration in time range)  aspirin  chewable tablet 324 mg (324 mg Oral Given 10/28/24 1100)  pantoprazole  (PROTONIX ) injection 40 mg (40 mg Intravenous Given 10/28/24 1100)  morphine  (PF) 4 MG/ML injection 4 mg (4 mg Intravenous Given 10/28/24 1100)  ondansetron  (ZOFRAN ) injection 4 mg (4 mg  Intravenous Given 10/28/24 1100)  lactated ringers  bolus 1,000 mL (0 mLs Intravenous Stopped 10/28/24 1217)  lactated ringers  bolus 1,000 mL (0 mLs Intravenous Stopped 10/28/24 1401)  iohexol  (OMNIPAQUE ) 350 MG/ML injection 75 mL (75 mLs Intravenous Contrast Given 10/28/24 1312)    Clinical Course as of 10/28/24 1455  Sun Oct 28, 2024  1142 Lipase, blood(!) Lipase elevated at 148. [JK]  1142 Troponin T, High Sensitivity Troponin normal [JK]  1142 Comprehensive metabolic panel(!) Metabolic panel does show decreased bicarb [JK]  1209 Patient still hypotensive.  Additional fluid bolus ordered.  Will proceed with CT scans [JK]  1310 I-Stat CG4 Lactic Acid(!!) Lactic acid elevated at 4.4 with his hypotension will start sepsis protocol [JK]  1345 CT scan does not show acute pancreatitis. [JK]  1345 Chest x-ray does not show pulmonary embolism.  Patient does have some mild diffuse tree-in-bud opacities suggestive of possible bronchiolitis or atypical infection  [JK]  1455 Case discussed with Dr Seena [JK]    Clinical Course User Index [JK] Randol Simmonds, MD                                 Medical Decision Making Differential diagnosis includes but not limited to acute coronary syndrome, aortic dissection, pulmonary embolism, pancreatitis, sepsis  Problems Addressed: Elevated lipase: acute illness or injury that poses a threat to life or bodily functions Pneumonia due to infectious organism, unspecified laterality, unspecified part of lung: acute illness or injury that poses a threat to life or bodily functions  Amount and/or Complexity of Data Reviewed Labs: ordered. Decision-making details documented in ED Course. Radiology: ordered and independent interpretation performed.  Risk OTC drugs. Prescription drug management. Decision regarding hospitalization.   Patient presented with acute chest pressure chest pain.  On arrival patient noted to have borderline blood pressures.  Afebrile but he was tachycardic.  Initial EKG without signs of acute ST elevation MI.  Patient was treated with pain medications IV fluids.  Initial troponin not suggestive of acute ischemia.  Patient did have an elevated lipase.  I was concerned about pancreatitis.  Was also concerned about pulmonary embolism dissection.  CT scans of the chest abdomen pelvis were performed.  No definite acute pancreatitis on abdominal pelvic CT scan.  The chest CT does show possible pneumonia.  With his elevated lactic acid level hypotension initially concerned about possibility of a sepsis.  Patient started on IV fluids antibiotics.  I will consult the medical service for admission.     Final diagnoses:  Pneumonia due to infectious organism, unspecified laterality, unspecified part of lung  Elevated lipase    ED Discharge Orders     None          Randol Simmonds, MD 10/28/24 1455  "

## 2024-10-28 NOTE — ED Notes (Signed)
 Lactic results to emily m.rn by at

## 2024-10-28 NOTE — Progress Notes (Addendum)
 Pharmacy Antibiotic Note  Anthony Nielsen is a 48 y.o. male for which pharmacy has been consulted for cefepime  and vancomycin  dosing for pneumonia.  Patient with a history of HTN, HLD, DM, anxiety, depression, obesity, EtOH use. Patient presenting with SOB.  SCr 0.91 WBC 14.7; LA 4.7; T 98.4; HR 100; RR 15  Plan: Azithromycin  and flagyl  per MD Cefepime  2g q8hr  Vancomycin  2000 mg once then 1500 mg q12hr (eAUC 505.8) unless change in renal function Monitor WBC, fever, renal function, cultures De-escalate when able Levels at steady state F/u MRSA PCR     Temp (24hrs), Avg:98.1 F (36.7 C), Min:97.8 F (36.6 C), Max:98.4 F (36.9 C)  Recent Labs  Lab 10/28/24 1051 10/28/24 1222 10/28/24 1432  WBC 14.7*  --   --   CREATININE 0.91  --   --   LATICACIDVEN  --  4.4* 4.7*    CrCl cannot be calculated (Unknown ideal weight.).    Allergies[1]  Microbiology results: Pending  Thank you for allowing pharmacy to be a part of this patients care.  Dorn Buttner, PharmD, BCPS 10/28/2024 3:10 PM ED Clinical Pharmacist -  [phone]       [1] No Known Allergies

## 2024-10-28 NOTE — ED Notes (Signed)
 Lactic results to emily . By at

## 2024-10-28 NOTE — ED Notes (Signed)
 CCMD called; Pt. Is on the monitor

## 2024-10-28 NOTE — H&P (Signed)
 " History and Physical   SIRIS HOOS FMW:[id number] DOB: [birth year] DOA: 10/28/2024  PCP: Joshua Debby CROME, MD   Patient coming from: Home  Chief Complaint: Chest pain, shortness of breath  HPI: Anthony Nielsen is a 48 y.o. male with medical history significant of hypertension, hyperlipidemia, diabetes, anxiety, depression, obesity, alcohol use, fatty liver disease presenting with chest pain shortness of breath.  Patient reportedly woke with severe chest pain and some associated shortness of breath.  Described as a pressure sensation.  Also reports some nausea and vomiting.  Reports alcohol use last night.  Does report recent exposure to family member with pneumonia.  Denies fevers, chills, constipation, diarrhea.  ED Course: Vital signs in ED notable for heart rate in 80s-110s, blood pressure in the 80s-100s systolic, respirate in the teens-20s.  Saturating well on room air.  Lab workup included CMP with potassium 3.3, bicarb 15, glucose 153, calcium  7.3, protein 5.9, ALT 45.  CBC with leukocytosis to 14.7.  Troponin normal with repeat pending.  Lipase 148.  Lactic acid trend 4.4, 4.7.  Blood cultures pending.  Chest x-ray showed no acute abnormality.  CTA PE study showed no evidence of PE but did show tree-in-bud opacities consistent with atypical versus viral pneumonia, changes consistent with pulmonary hypertension.  CT on pelvis showed no evidence of acute abnormality, no evidence of ischemic bowel, liver steatosis.  Patient received cefepime , Flagyl , 4 L of IV fluid including 2 L recently ordered, 150 cc an hour, ASA, morphine , PPI, Zofran  in the ED.  Review of Systems: As per HPI otherwise all other systems reviewed and are negative.  Past Medical History:  Diagnosis Date   Hyperlipidemia    Hypertension    Smoker     Past Surgical History:  Procedure Laterality Date   BACK SURGERY  05/18/2021   INGUINAL HERNIA REPAIR Right 09/1998   LUMBAR LAMINECTOMY/DECOMPRESSION  MICRODISCECTOMY Right 01/13/2022   Procedure: Redo Microdiscectomy  - Lumbar five-Sacral one - right;  Surgeon: Joshua Alm RAMAN, MD;  Location: [facility] OR;  Service: Neurosurgery;  Laterality: Right;    Social History  reports that he has been smoking cigarettes. He has a 150 pack-year smoking history. He has never used smokeless tobacco. He reports that he does not currently use alcohol. He reports that he does not currently use drugs after having used the following drugs: Marijuana.  Allergies[1]  Family History  Problem Relation Age of Onset   Colon cancer Mother    Hypertension Mother    Osteoarthritis Mother    Cancer Father        pancreas   Colon polyps Neg Hx    Esophageal cancer Neg Hx    Stomach cancer Neg Hx    Rectal cancer Neg Hx    Sleep apnea Neg Hx   Reviewed on admission  Prior to Admission medications  Medication Sig Start Date End Date Taking? Authorizing Provider  escitalopram  (LEXAPRO ) 5 MG tablet TAKE 1 TABLET(5 MG) BY MOUTH DAILY 04/18/24   Joshua Debby CROME, MD  omega-3 acid ethyl esters (LOVAZA ) 1 g capsule Take 2 capsules (2 g total) by mouth 2 (two) times daily. NEEDS FOLLOW UP VISIT FOR REFILLS 05/09/24   Joshua Debby CROME, MD  rosuvastatin  (CRESTOR ) 20 MG tablet TAKE 1 TABLET(20 MG) BY MOUTH DAILY. Patient to follow up with PCP prior to future refills 05/07/24   Joshua Debby CROME, MD  tirzepatide  (MOUNJARO ) 2.5 MG/0.5ML Pen Inject 2.5 mg into the skin once a week. 02/03/24  Joshua Debby CROME, MD  traZODone  (DESYREL ) 50 MG tablet TAKE 1 TABLET(50 MG) BY MOUTH AT BEDTIME 04/18/24   Joshua Debby CROME, MD    Physical Exam: Vitals:   10/28/24 1033 10/28/24 1202 10/28/24 1207 10/28/24 1315  BP: 91/64 (!) 84/48 (!) 81/54 102/73  Pulse: 87 (!) 106 (!) 108 100  Resp: 18 12 (!) 21 15  Temp: 97.8 F (36.6 C) 98.4 F (36.9 C)    TempSrc: Oral     SpO2: 97% 100% 97% 96%    Physical Exam Constitutional:      General: He is not in acute distress.    Appearance: Normal  appearance.  HENT:     Head: Normocephalic and atraumatic.     Mouth/Throat:     Mouth: Mucous membranes are moist.     Pharynx: Oropharynx is clear.  Eyes:     Extraocular Movements: Extraocular movements intact.     Pupils: Pupils are equal, round, and reactive to light.  Cardiovascular:     Rate and Rhythm: Normal rate and regular rhythm.     Pulses: Normal pulses.     Heart sounds: Normal heart sounds.  Pulmonary:     Effort: Pulmonary effort is normal. No respiratory distress.     Breath sounds: Normal breath sounds.  Abdominal:     General: Bowel sounds are normal. There is no distension.     Palpations: Abdomen is soft.     Tenderness: There is no abdominal tenderness.  Musculoskeletal:        General: No swelling or deformity.  Skin:    General: Skin is warm and dry.  Neurological:     General: No focal deficit present.     Mental Status: Mental status is at baseline.    Labs on Admission: I have personally reviewed following labs and imaging studies  CBC: Recent Labs  Lab 10/28/24 1051  WBC 14.7*  HGB 16.8  HCT 48.7  MCV 92.4  PLT 319    Basic Metabolic Panel: Recent Labs  Lab 10/28/24 1051  NA 137  K 3.3*  CL 108  CO2 15*  GLUCOSE 153*  BUN 19  CREATININE 0.91  CALCIUM  7.3*    GFR: CrCl cannot be calculated (Unknown ideal weight.).  Liver Function Tests: Recent Labs  Lab 10/28/24 1051  AST 26  ALT 45*  ALKPHOS 52  BILITOT 0.2  PROT 5.9*  ALBUMIN 3.9    Urine analysis:    Component Value Date/Time   COLORURINE YELLOW 02/02/2024 0840   APPEARANCEUR CLEAR 02/02/2024 0840   LABSPEC >=1.030 (A) 02/02/2024 0840   PHURINE 5.5 02/02/2024 0840   GLUCOSEU NEGATIVE 02/02/2024 0840   HGBUR NEGATIVE 02/02/2024 0840   BILIRUBINUR SMALL (A) 02/02/2024 0840   BILIRUBINUR N 03/07/2018 1718   KETONESUR NEGATIVE 02/02/2024 0840   PROTEINUR trace 03/07/2018 1718   UROBILINOGEN 0.2 02/02/2024 0840   NITRITE NEGATIVE 02/02/2024 0840    LEUKOCYTESUR NEGATIVE 02/02/2024 0840    Radiological Exams on Admission: CT ABDOMEN PELVIS W CONTRAST Result Date: 10/28/2024 CLINICAL DATA:  Provided history: chest pain, elevated lipase, heavy alcohol use history EXAM: CT ABDOMEN AND PELVIS WITH CONTRAST TECHNIQUE: Multidetector CT imaging of the abdomen and pelvis was performed using the standard protocol following bolus administration of intravenous contrast. RADIATION DOSE REDUCTION: This exam was performed according to the departmental dose-optimization program which includes automated exposure control, adjustment of the mA and/or kV according to patient size and/or use of iterative reconstruction technique. CONTRAST:  75mL  OMNIPAQUE  IOHEXOL  350 MG/ML SOLN COMPARISON:  CT 01/21/2022.  MRI 03/01/2022 FINDINGS: Lower chest: Assessed on concurrent chest CT, reported separately. Hepatobiliary: Diffuse hepatic steatosis. No focal liver lesion. Gallbladder physiologically distended, no calcified stone. No biliary dilatation. Pancreas: No fat stranding or inflammatory change about the pancreas. No ductal dilatation or pancreatic mass. Spleen: Normal in size without focal abnormality. Adrenals/Urinary Tract: Stable a 2.8 cm right adrenal nodule, previously characterized as adenoma. Normal left adrenal gland. No hydronephrosis or renal calculi. Mild symmetric bilateral perinephric stranding. No suspicious renal lesion. Unremarkable urinary bladder. Stomach/Bowel: Decompressed stomach. No small bowel obstruction or inflammation. Normal appendix. Wall thickening versus nondistention of the ascending colon. Small volume of formed colonic stool. Vascular/Lymphatic: Mild aorto bi-iliac atherosclerosis. No aortic aneurysm. The portal vein is patent. Splenic and mesenteric veins are patent. No enlarged lymph nodes in the abdomen or pelvis. Reproductive: Prostate is unremarkable. Other: No free air, free fluid, or intra-abdominal fluid collection. Small fat containing  umbilical hernia. Musculoskeletal: L5-S1 degenerative disc disease with multilevel lower lumbar facet hypertrophy. CT CT bone IMPRESSION: 1. No CT findings of acute pancreatitis. 2. Wall thickening versus nondistention of the ascending colon, can be seen with colitis. 3. Hepatic steatosis. 4. Stable right adrenal adenoma. Aortic Atherosclerosis (ICD10-I70.0). Electronically Signed   By: Andrea Gasman M.D.   On: 10/28/2024 13:27   CT Angio Chest PE W and/or Wo Contrast Result Date: 10/28/2024 CLINICAL DATA:  Pulmonary embolism (PE) suspected, high prob Chest pain. EXAM: CT ANGIOGRAPHY CHEST WITH CONTRAST TECHNIQUE: Multidetector CT imaging of the chest was performed using the standard protocol during bolus administration of intravenous contrast. Multiplanar CT image reconstructions and MIPs were obtained to evaluate the vascular anatomy. Performed in conjunction with CT of the abdomen and pelvis. RADIATION DOSE REDUCTION: This exam was performed according to the departmental dose-optimization program which includes automated exposure control, adjustment of the mA and/or kV according to patient size and/or use of iterative reconstruction technique. CONTRAST:  75mL OMNIPAQUE  IOHEXOL  350 MG/ML SOLN COMPARISON:  Radiograph earlier today FINDINGS: Cardiovascular: There are no filling defects within the pulmonary arteries to suggest pulmonary embolus. The main pulmonary artery is dilated at 4 cm. The heart is borderline enlarged. No acute aortic findings. Common origin of the brachiocephalic and left common carotid artery, variant arch anatomy. No pericardial effusion. Mediastinum/Nodes: No mediastinal or hilar adenopathy. Unremarkable appearance of the esophagus. 7 mm distal paraesophageal lymph node, series 6, image 113. No visible thyroid  nodule. Lungs/Pleura: Mild diffuse tree-in-bud opacities throughout both lungs. No confluent consolidation. No features of pulmonary edema. No pleural fluid. Upper Abdomen:  Assessed on concurrent abdominopelvic CT, reported separately. Musculoskeletal: There are no acute or suspicious osseous abnormalities. Remote healed left rib fractures. Review of the MIP images confirms the above findings. IMPRESSION: 1. No pulmonary embolus. 2. Mild diffuse tree-in-bud opacities throughout both lungs, typical of bronchiolitis or atypical infection. 3. Dilated main pulmonary artery at 4 cm, can be seen with pulmonary arterial hypertension. Electronically Signed   By: Andrea Gasman M.D.   On: 10/28/2024 13:23   DG Chest Portable 1 View Result Date: 10/28/2024 EXAM: 1 VIEW(S) XRAY OF THE CHEST 10/28/2024 11:22:00 AM COMPARISON: None available. CLINICAL HISTORY: FINDINGS: LUNGS AND PLEURA: Low normal lung volumes. No focal pulmonary opacity. No pleural effusion. No pneumothorax. HEART AND MEDIASTINUM: No acute abnormality of the cardiac and mediastinal silhouettes. BONES AND SOFT TISSUES: Chronic left rib fractures. IMPRESSION: 1. No acute cardiopulmonary abnormality. Electronically signed by: Waddell Calk MD 10/28/2024 11:39 AM  EST RP Workstation: [id number]   EKG: Independently reviewed.  Sinus tachycardia at heart beats minute.  Nonspecific T wave changes.  Assessment/Plan Principal Problem:   Sepsis due to pneumonia [facility]) Active Problems:   Hypertension   Moderate episode of recurrent major depressive disorder (HCC)   NAFLD (nonalcoholic fatty liver disease)   Hypertriglyceridemia, essential   Obesity, morbid (HCC)   Type 2 diabetes mellitus without complication, without long-term current use of insulin  (HCC)   Panic anxiety syndrome   Sepsis > Patient presented with chest pain shortness of breath.  Troponin normal with repeat pending.  Lipase mildly elevated.  CT with no evidence of pancreatitis.  CT chest did show evidence of atypical versus viral pneumonia. > Meets sepsis criteria with tachycardia, tachypnea, leukocytosis to 14.7, lactic acid trend 4.4, 4.7.  Presumed  etiology of pneumonia. > Received total of 4 L of IV fluid in the ED, lactic acid continued to uptrend though this was drawn before most of the final 2 L was given. > Blood pressure is improved while receiving boluses, will monitor for persistent normotension after completion of IV fluid boluses. > Received cefepime , Flagyl  in the ED. > Given sepsis with atypical pneumonia as a source is presumed but not fully confirmed will broaden antibiotics further. - Monitor on progressive unit overnight - Continue with cefepime  and Flagyl , add vancomycin , add azithromycin  for atypical coverage - Follow-up blood cultures - Check urinalysis, respiratory viral panel for flu COVID RSV, procalcitonin - Trend fever curve and WBC - Follow-up blood cultures - Continue IV fluids - Supportive care  Elevated lipase > No evidence of pancreatitis on CT.  Has been taking Zepbound . - Holding Zepbound   Chest pain > Had improved, with history of anxiety and negative troponin.  Some recurrence of pain, will give Xanax  as needed for anxiety. - As needed Xanax   Hypertension > Has had intermittent adherence to his medication.  Has been using his blood pressure medicine regularly since Thanksgiving - Hypotensive here, IV fluids  Diabetes - SSI  Anxiety Depression - Intermittent adherence to medications outpatient - As needed Xanax  as above  Alcohol use > Last drink was yesterday.  Drinks just a couple times per month.  No concern for alcohol withdrawal risk.  Obesity - Noted   DVT prophylaxis: Lovenox  Code Status:   Full Family Communication:  None on admission  Disposition Plan:   Patient is from:  Home  Anticipated DC to:  Home  Anticipated DC date:  2 to 4 days  Anticipated DC barriers: None  Consults called:  None Admission status:  Inpatient, progressive  Severity of Illness: The appropriate patient status for this patient is INPATIENT. Inpatient status is judged to be reasonable and  necessary in order to provide the required intensity of service to ensure the patient's safety. The patient's presenting symptoms, physical exam findings, and initial radiographic and laboratory data in the context of their chronic comorbidities is felt to place them at high risk for further clinical deterioration. Furthermore, it is not anticipated that the patient will be medically stable for discharge from the hospital within 2 midnights of admission.   * I certify that at the point of admission it is my clinical judgment that the patient will require inpatient hospital care spanning beyond 2 midnights from the point of admission due to high intensity of service, high risk for further deterioration and high frequency of surveillance required.DEWAINE Marsa KATHEE Seena MD [facility]  How to contact the [facility]  Attending or Consulting provider 7A - 7P or covering provider during after hours 7P -7A, for this patient?   Check the care team in [facility] and look for a) attending/consulting [facility] provider listed and b) the [facility] team listed Log into [email or web address] and use [facility] universal password to access. If you do not have the password, please contact the hospital operator. Locate the [facility] provider you are looking for under [facility] and page to a number that you can be directly reached. If you still have difficulty reaching the provider, please page the [facility] (Director on Call) for the Hospitalists listed on amion for assistance.  10/28/2024, 3:31 PM       [1] No Known Allergies  "

## 2024-10-28 NOTE — ED Notes (Signed)
 MD at [facility]

## 2024-10-28 NOTE — ED Triage Notes (Signed)
 Pt. BIB [facility] from home with c/o chest pain and SOB; the pt. Stated that it started at 0930 this morning suddenly and has been 8/10 pain since. The pt. Had some ETOH use last night and has a Hx of HTN; [facility] administered 1 Nitroglycerin and 4mg s of Zofran . Pt. Has a 20G in R AC.   VS per [facility]:  BP: 92/58 HR: 102 SpO2: 92% RA CBG: 212

## 2024-10-28 NOTE — ED Notes (Addendum)
 Pt. BP is 81/54; EDP notified and orders put in

## 2024-10-28 NOTE — ED Notes (Signed)
 XR at [facility].

## 2024-10-28 NOTE — ED Notes (Signed)
 When you have time, Jaydrien Wassenaar (mother) [phone] would like an update on pt. Thank you

## 2024-10-29 ENCOUNTER — Inpatient Hospital Stay (HOSPITAL_COMMUNITY): Payer: Self-pay

## 2024-10-29 ENCOUNTER — Encounter (HOSPITAL_COMMUNITY): Payer: Self-pay | Admitting: Internal Medicine

## 2024-10-29 LAB — COMPREHENSIVE METABOLIC PANEL WITH GFR
ALT: 42 U/L (ref 0–44)
AST: 24 U/L (ref 15–41)
Albumin: 4 g/dL (ref 3.5–5.0)
Alkaline Phosphatase: 54 U/L (ref 38–126)
Anion gap: 10 (ref 5–15)
BUN: 16 mg/dL (ref 6–20)
CO2: 22 mmol/L (ref 22–32)
Calcium: 8.6 mg/dL — ABNORMAL LOW (ref 8.9–10.3)
Chloride: 106 mmol/L (ref 98–111)
Creatinine, Ser: 1.01 mg/dL (ref 0.61–1.24)
GFR, Estimated: >60 mL/min
Glucose, Bld: 87 mg/dL (ref 70–99)
Potassium: 3.7 mmol/L (ref 3.5–5.1)
Sodium: 139 mmol/L (ref 135–145)
Total Bilirubin: 0.4 mg/dL (ref 0.0–1.2)
Total Protein: 6.1 g/dL — ABNORMAL LOW (ref 6.5–8.1)

## 2024-10-29 LAB — TROPONIN T, HIGH SENSITIVITY: Troponin T High Sensitivity: 16 ng/L (ref 0–19)

## 2024-10-29 LAB — CBG MONITORING, ED: Glucose-Capillary: 105 mg/dL — ABNORMAL HIGH (ref 70–99)

## 2024-10-29 LAB — CBC
HCT: 43 % (ref 39.0–52.0)
Hemoglobin: 15 g/dL (ref 13.0–17.0)
MCH: 31.8 pg (ref 26.0–34.0)
MCHC: 34.9 g/dL (ref 30.0–36.0)
MCV: 91.3 fL (ref 80.0–100.0)
Platelets: 235 K/uL (ref 150–400)
RBC: 4.71 MIL/uL (ref 4.22–5.81)
RDW: 13.4 % (ref 11.5–15.5)
WBC: 13.4 K/uL — ABNORMAL HIGH (ref 4.0–10.5)
nRBC: 0 % (ref 0.0–0.2)

## 2024-10-29 LAB — PROTIME-INR
INR: 1 (ref 0.8–1.2)
Prothrombin Time: 13.8 s (ref 11.4–15.2)

## 2024-10-29 LAB — GLUCOSE, CAPILLARY
Glucose-Capillary: 108 mg/dL — ABNORMAL HIGH (ref 70–99)
Glucose-Capillary: 111 mg/dL — ABNORMAL HIGH (ref 70–99)

## 2024-10-29 LAB — PROCALCITONIN: Procalcitonin: <0.1 ng/mL

## 2024-10-29 LAB — STREP PNEUMONIAE URINARY ANTIGEN: Strep Pneumo Urinary Antigen: NEGATIVE

## 2024-10-29 LAB — CORTISOL-AM, BLOOD: Cortisol - AM: 3.5 ug/dL — ABNORMAL LOW (ref 6.7–22.6)

## 2024-10-29 MED ORDER — SODIUM CHLORIDE 0.9 % IV SOLN
1.0000 g | Freq: Every day | INTRAVENOUS | Status: DC
  Administered 2024-10-29: 1 g via INTRAVENOUS
  Filled 2024-10-29 (×3): qty 10

## 2024-10-29 NOTE — Plan of Care (Signed)

## 2024-10-29 NOTE — ED Notes (Signed)
 Requested ordered medication from pharmacy

## 2024-10-29 NOTE — Progress Notes (Signed)
 " PROGRESS NOTE    ATILANO COVELLI  FMW:[id number] DOB: Jun 24, 1976 DOA: 10/28/2024 PCP: Joshua Debby CROME, MD  Subjective: Patient reports that his chest pain has subsided and nausea is also improving. Still has some shortness of breath but much improved now   Hospital Course: 48 y.o. male with medical history significant of hypertension, hyperlipidemia, diabetes, anxiety, depression, obesity, alcohol use, fatty liver disease presenting with chest pain shortness of breath. CT chest was negative for PE but showed findings consistent with atypical vs viral pneumonia and changes consistent with pulmonary hypertension.    Assessment and Plan:  Sepsis > Patient presented with chest pain shortness of breath.  Troponin normal with repeat pending.  Lipase mildly elevated.  CT with no evidence of pancreatitis.  CT chest did show evidence of atypical versus viral pneumonia. Meets sepsis criteria with tachycardia, tachypnea, leukocytosis to 14.7, lactic acid trend 4.4, 4.7.  Presumed etiology of pneumonia. > Received total of 4 L of IV fluid in the ED, lactic acidosis resolved now  > Blood pressure is improved while receiving boluses, will monitor for persistent normotension after completion of IV fluid boluses. - will narrow down Abx to ceftriaxone  and azithromycin   - Follow-up blood cultures   Elevated lipase > No evidence of pancreatitis on CT.  Has been taking Zepbound . - Holding Zepbound    Chest pain > resolved, was likely related to anxiety  - As needed Xanax    Hypertension > Has had intermittent adherence to his medication.  Has been using his blood pressure medicine regularly since Thanksgiving - was initially hypotensive but improved with IVF - hold home meds as BP still on the softer side    Diabetes - SSI   Anxiety Depression - Intermittent adherence to medications outpatient - As needed Xanax  as above   Alcohol use > Last drink was yesterday.  Drinks just a couple times  per month.  No concern for alcohol withdrawal risk.   Obesity - Noted   Right foot pain and swelling  - will obtain an Xray     DVT prophylaxis: enoxaparin  (LOVENOX ) injection 40 mg Start: 10/28/24 2000    Code Status: Full Code Disposition Plan: Home Reason for continuing need for hospitalization: IV antibiotics   Objective: Vitals:   10/29/24 1000 10/29/24 1127 10/29/24 1149 10/29/24 1241  BP: 95/62  93/81 98/68  Pulse: 85 78 88 86  Resp: 15 15 14 17   Temp:   98.3 F (36.8 C) 97.9 F (36.6 C)  TempSrc:    Oral  SpO2:  96% 96% 94%    Intake/Output Summary (Last 24 hours) at 10/29/2024 1546 Last data filed at 10/29/2024 9276 Gross per 24 hour  Intake 2858.5 ml  Output --  Net 2858.5 ml   There were no vitals filed for this visit.  Examination:  Physical Exam  Data Reviewed: I have personally reviewed following labs and imaging studies  CBC: Recent Labs  Lab 10/28/24 1051 10/29/24 0126  WBC 14.7* 13.4*  HGB 16.8 15.0  HCT 48.7 43.0  MCV 92.4 91.3  PLT 319 235   Basic Metabolic Panel: Recent Labs  Lab 10/28/24 1051 10/29/24 0126  NA 137 139  K 3.3* 3.7  CL 108 106  CO2 15* 22  GLUCOSE 153* 87  BUN 19 16  CREATININE 0.91 1.01  CALCIUM  7.3* 8.6*   GFR: CrCl cannot be calculated (Unknown ideal weight.). Liver Function Tests: Recent Labs  Lab 10/28/24 1051 10/29/24 0126  AST 26 24  ALT  45* 42  ALKPHOS 52 54  BILITOT 0.2 0.4  PROT 5.9* 6.1*  ALBUMIN 3.9 4.0   Recent Labs  Lab 10/28/24 1051  LIPASE 148*   No results for input(s): AMMONIA in the last 168 hours. Coagulation Profile: Recent Labs  Lab 10/29/24 0126  INR 1.0   Cardiac Enzymes: No results for input(s): CKTOTAL, CKMB, CKMBINDEX, TROPONINI in the last 168 hours. ProBNP, BNP (last 5 results) No results for input(s): PROBNP, BNP in the last 8760 hours. HbA1C: No results for input(s): HGBA1C in the last 72 hours. CBG: Recent Labs  Lab 10/28/24 1704  10/28/24 1748 10/29/24 0745 10/29/24 1252  GLUCAP 173* 134* 105* 108*   Lipid Profile: No results for input(s): CHOL, HDL, LDLCALC, TRIG, CHOLHDL, LDLDIRECT in the last 72 hours. Thyroid  Function Tests: No results for input(s): TSH, T4TOTAL, FREET4, T3FREE, THYROIDAB in the last 72 hours. Anemia Panel: No results for input(s): VITAMINB12, FOLATE, FERRITIN, TIBC, IRON, RETICCTPCT in the last 72 hours. Sepsis Labs: Recent Labs  Lab 10/28/24 1222 10/28/24 1432 10/28/24 1520 10/28/24 2307 10/29/24 0126  PROCALCITON  --   --  <0.10  --  <0.10  LATICACIDVEN 4.4* 4.7*  --  1.2  --     Recent Results (from the past 240 hours)  Blood culture (routine x 2)     Status: None (Preliminary result)   Collection Time: 10/28/24  1:25 PM   Specimen: BLOOD RIGHT ARM  Result Value Ref Range Status   Specimen Description BLOOD RIGHT ARM  Final   Special Requests   Final    BOTTLES DRAWN AEROBIC AND ANAEROBIC Blood Culture results may not be optimal due to an inadequate volume of blood received in culture bottles   Culture   Final    NO GROWTH < 24 HOURS Performed at [facility], [address], [place], [place] [address]    Report Status PENDING  Incomplete  Resp panel by RT-PCR (RSV, Flu A&B, Covid) Anterior Nasal Swab     Status: None   Collection Time: 10/28/24  3:06 PM   Specimen: Anterior Nasal Swab  Result Value Ref Range Status   SARS Coronavirus 2 by RT PCR NEGATIVE NEGATIVE Final   Influenza A by PCR NEGATIVE NEGATIVE Final   Influenza B by PCR NEGATIVE NEGATIVE Final    Comment: (NOTE) The Xpert Xpress SARS-CoV-2/FLU/RSV plus assay is intended as an aid in the diagnosis of influenza from Nasopharyngeal swab specimens and should not be used as a sole basis for treatment. Nasal washings and aspirates are unacceptable for Xpert Xpress SARS-CoV-2/FLU/RSV testing.  Fact Sheet for Patients: bloggercourse.com  Fact  Sheet for Healthcare Providers: seriousbroker.it  This test is not yet approved or cleared by the [place]  FDA and has been authorized for detection and/or diagnosis of SARS-CoV-2 by FDA under an Emergency Use Authorization (EUA). This EUA will remain in effect (meaning this test can be used) for the duration of the COVID-19 declaration under Section 564(b)(1) of the Act, 21 U.S.C. section 360bbb-3(b)(1), unless the authorization is terminated or revoked.     Resp Syncytial Virus by PCR NEGATIVE NEGATIVE Final    Comment: (NOTE) Fact Sheet for Patients: bloggercourse.com  Fact Sheet for Healthcare Providers: seriousbroker.it  This test is not yet approved or cleared by the [place]  FDA and has been authorized for detection and/or diagnosis of SARS-CoV-2 by FDA under an Emergency Use Authorization (EUA). This EUA will remain in effect (meaning this test can be used) for  the duration of the COVID-19 declaration under Section 564(b)(1) of the Act, 21 U.S.C. section 360bbb-3(b)(1), unless the authorization is terminated or revoked.  Performed at [facility], [address], [place], [place] [address]      Radiology Studies: DG Toe Great Right Result Date: 10/29/2024 EXAM: VIEW(S) XRAY OF THE TOES 10/29/2024 12:22:00 PM COMPARISON: None available. CLINICAL HISTORY: Pain FINDINGS: BONES AND JOINTS: Acute nondisplaced fracture of great toe proximal phalanx. Intra-articular extension. No malalignment. SOFT TISSUES: Mild soft tissue swelling. IMPRESSION: 1. Acute nondisplaced intraarticular fracture of the great toe proximal phalanx. Electronically signed by: Selinda Blue MD 10/29/2024 01:10 PM EST RP Workstation: [id number]   CT ABDOMEN PELVIS W CONTRAST Result Date: 10/28/2024 CLINICAL DATA:  Provided history: chest pain, elevated lipase, heavy alcohol use history EXAM: CT ABDOMEN AND PELVIS WITH  CONTRAST TECHNIQUE: Multidetector CT imaging of the abdomen and pelvis was performed using the standard protocol following bolus administration of intravenous contrast. RADIATION DOSE REDUCTION: This exam was performed according to the departmental dose-optimization program which includes automated exposure control, adjustment of the mA and/or kV according to patient size and/or use of iterative reconstruction technique. CONTRAST:  75mL OMNIPAQUE  IOHEXOL  350 MG/ML SOLN COMPARISON:  CT 01/21/2022.  MRI 03/01/2022 FINDINGS: Lower chest: Assessed on concurrent chest CT, reported separately. Hepatobiliary: Diffuse hepatic steatosis. No focal liver lesion. Gallbladder physiologically distended, no calcified stone. No biliary dilatation. Pancreas: No fat stranding or inflammatory change about the pancreas. No ductal dilatation or pancreatic mass. Spleen: Normal in size without focal abnormality. Adrenals/Urinary Tract: Stable a 2.8 cm right adrenal nodule, previously characterized as adenoma. Normal left adrenal gland. No hydronephrosis or renal calculi. Mild symmetric bilateral perinephric stranding. No suspicious renal lesion. Unremarkable urinary bladder. Stomach/Bowel: Decompressed stomach. No small bowel obstruction or inflammation. Normal appendix. Wall thickening versus nondistention of the ascending colon. Small volume of formed colonic stool. Vascular/Lymphatic: Mild aorto bi-iliac atherosclerosis. No aortic aneurysm. The portal vein is patent. Splenic and mesenteric veins are patent. No enlarged lymph nodes in the abdomen or pelvis. Reproductive: Prostate is unremarkable. Other: No free air, free fluid, or intra-abdominal fluid collection. Small fat containing umbilical hernia. Musculoskeletal: L5-S1 degenerative disc disease with multilevel lower lumbar facet hypertrophy. CT CT bone IMPRESSION: 1. No CT findings of acute pancreatitis. 2. Wall thickening versus nondistention of the ascending colon, can be seen  with colitis. 3. Hepatic steatosis. 4. Stable right adrenal adenoma. Aortic Atherosclerosis (ICD10-I70.0). Electronically Signed   By: Andrea Gasman M.D.   On: 10/28/2024 13:27   CT Angio Chest PE W and/or Wo Contrast Result Date: 10/28/2024 CLINICAL DATA:  Pulmonary embolism (PE) suspected, high prob Chest pain. EXAM: CT ANGIOGRAPHY CHEST WITH CONTRAST TECHNIQUE: Multidetector CT imaging of the chest was performed using the standard protocol during bolus administration of intravenous contrast. Multiplanar CT image reconstructions and MIPs were obtained to evaluate the vascular anatomy. Performed in conjunction with CT of the abdomen and pelvis. RADIATION DOSE REDUCTION: This exam was performed according to the departmental dose-optimization program which includes automated exposure control, adjustment of the mA and/or kV according to patient size and/or use of iterative reconstruction technique. CONTRAST:  75mL OMNIPAQUE  IOHEXOL  350 MG/ML SOLN COMPARISON:  Radiograph earlier today FINDINGS: Cardiovascular: There are no filling defects within the pulmonary arteries to suggest pulmonary embolus. The main pulmonary artery is dilated at 4 cm. The heart is borderline enlarged. No acute aortic findings. Common origin of the brachiocephalic and left common carotid artery, variant arch anatomy. No pericardial effusion.  Mediastinum/Nodes: No mediastinal or hilar adenopathy. Unremarkable appearance of the esophagus. 7 mm distal paraesophageal lymph node, series 6, image 113. No visible thyroid  nodule. Lungs/Pleura: Mild diffuse tree-in-bud opacities throughout both lungs. No confluent consolidation. No features of pulmonary edema. No pleural fluid. Upper Abdomen: Assessed on concurrent abdominopelvic CT, reported separately. Musculoskeletal: There are no acute or suspicious osseous abnormalities. Remote healed left rib fractures. Review of the MIP images confirms the above findings. IMPRESSION: 1. No pulmonary  embolus. 2. Mild diffuse tree-in-bud opacities throughout both lungs, typical of bronchiolitis or atypical infection. 3. Dilated main pulmonary artery at 4 cm, can be seen with pulmonary arterial hypertension. Electronically Signed   By: Andrea Gasman M.D.   On: 10/28/2024 13:23   DG Chest Portable 1 View Result Date: 10/28/2024 EXAM: 1 VIEW(S) XRAY OF THE CHEST 10/28/2024 11:22:00 AM COMPARISON: None available. CLINICAL HISTORY: FINDINGS: LUNGS AND PLEURA: Low normal lung volumes. No focal pulmonary opacity. No pleural effusion. No pneumothorax. HEART AND MEDIASTINUM: No acute abnormality of the cardiac and mediastinal silhouettes. BONES AND SOFT TISSUES: Chronic left rib fractures. IMPRESSION: 1. No acute cardiopulmonary abnormality. Electronically signed by: Waddell Calk MD 10/28/2024 11:39 AM EST RP Workstation: [id number]    Scheduled Meds:  enoxaparin  (LOVENOX ) injection  40 mg Subcutaneous Q24H   insulin  aspart  0-9 Units Subcutaneous TID WC   sodium chloride  flush  3 mL Intravenous Q12H   Continuous Infusions:  azithromycin  500 mg (10/29/24 1515)   ceFEPime  (MAXIPIME ) IV 2 g (10/29/24 1312)   metronidazole  Stopped (10/29/24 1356)   vancomycin  1,500 mg (10/29/24 1509)     LOS: 1 day   Time spent: 38 minutes  Casimer Dare, MD  [facility]  10/29/2024, 3:46 PM   "

## 2024-10-29 NOTE — Progress Notes (Signed)
 Pt arrived from ..ED..., A/ox .4.Marland Kitchenpt denies any pain, MD aware,CCMD called. CHG bath given,no further needs at this time

## 2024-10-29 NOTE — Progress Notes (Signed)
" ° °  Brief Progress Note   _____________________________________________________________________________________________________________  Patient Name: Anthony Nielsen Patient DOB: 06/12/1976 Date: @TODAY @      Data: Reviewed labs, VS, notes.     Action: No action needed at this time.      Response:    _____________________________________________________________________________________________________________  [facility] Sharolyn JONETTA Batman Please contact us  directly via secure chat (search for [facility]) or by calling us  at [phone] [facility]).  "

## 2024-10-30 ENCOUNTER — Other Ambulatory Visit (HOSPITAL_COMMUNITY): Payer: Self-pay

## 2024-10-30 LAB — GLUCOSE, CAPILLARY
Glucose-Capillary: 107 mg/dL — ABNORMAL HIGH (ref 70–99)
Glucose-Capillary: 101 mg/dL — ABNORMAL HIGH (ref 70–99)
Glucose-Capillary: 86 mg/dL (ref 70–99)

## 2024-10-30 LAB — LEGIONELLA PNEUMOPHILA SEROGP 1 UR AG

## 2024-10-30 MED ORDER — CEFUROXIME AXETIL 500 MG PO TABS
500.0000 mg | ORAL_TABLET | Freq: Two times a day (BID) | ORAL | 0 refills | Status: AC
  Filled 2024-10-30: qty 6, 3d supply, fill #0

## 2024-10-30 MED ORDER — AZITHROMYCIN 500 MG PO TABS
500.0000 mg | ORAL_TABLET | Freq: Every day | ORAL | 0 refills | Status: AC
  Filled 2024-10-30: qty 3, 3d supply, fill #0

## 2024-10-30 NOTE — Discharge Summary (Signed)
 " Physician Discharge Summary   Patient: Anthony Nielsen MRN: [id number] DOB: Jan 16, 1976  Admit date:     10/28/2024  Discharge date: 10/30/2024  Discharge Physician: Casimer Dare   PCP: Joshua Debby CROME, MD   Recommendations at discharge:    Discuss with PCP regarding Zepbound  with his elevated lipase   Discharge Diagnoses: Principal Problem:   Sepsis due to pneumonia [facility]) Active Problems:   Hypertension   Moderate episode of recurrent major depressive disorder (HCC)   NAFLD (nonalcoholic fatty liver disease)   Hypertriglyceridemia, essential   Obesity, morbid (HCC)   Type 2 diabetes mellitus without complication, without long-term current use of insulin  (HCC)   Panic anxiety syndrome   Sepsis [facility])   Hospital Course: 48 y.o. male with medical history significant of hypertension, hyperlipidemia, diabetes, anxiety, depression, obesity, alcohol use, fatty liver disease presenting with chest pain shortness of breath. CT chest was negative for PE but showed findings consistent with atypical vs viral pneumonia and changes consistent with pulmonary hypertension. He was managed with IV antibiotics with improvement in symptoms    Assessment and Plan:  Sepsis > Patient presented with chest pain shortness of breath.  Troponin normal with repeat pending.  Lipase mildly elevated.  CT with no evidence of pancreatitis.  CT chest did show evidence of atypical versus viral pneumonia. Meets sepsis criteria with tachycardia, tachypnea, leukocytosis to 14.7, lactic acid trend 4.4, 4.7.  Presumed etiology of pneumonia. > Received total of 4 L of IV fluid in the ED, lactic acidosis resolved now  > Blood pressure is improved while receiving boluses - antibiotics were narrowed down and he was discharged on Vantin and Azithromycin     Elevated lipase > No evidence of pancreatitis on CT.  Has been taking Zepbound . - Holding Zepbound  until he discusses with his PCP   Chest pain > resolved, was likely  related to anxiety    Hypertension > Has had intermittent adherence to his medication.  Has been using his blood pressure medicine regularly since Thanksgiving - was initially hypotensive but improved with IVF   Diabetes - SSI   Anxiety Depression - Intermittent adherence to medications outpatient - As needed Xanax  as above   Alcohol use Drinks just a couple times per month.  No concern for alcohol withdrawal risk.   Obesity - Noted       Consultants: None Procedures performed: None  Disposition: Home Diet recommendation:  Carb modified diet DISCHARGE MEDICATION: Allergies as of 10/30/2024   No Known Allergies      Medication List     STOP taking these medications    tirzepatide  2.5 MG/0.5ML Pen Commonly known as: MOUNJARO        TAKE these medications    Advil PM 200-38 MG Tabs Generic drug: Ibuprofen-diphenhydrAMINE Cit Take 2 tablets by mouth daily as needed (pain).   azithromycin  500 MG tablet Commonly known as: Zithromax  Take 1 tablet (500 mg total) by mouth daily for 3 days.   cefpodoxime 200 MG tablet Commonly known as: VANTIN Take 1 tablet (200 mg total) by mouth 2 (two) times daily for 3 days.   Edarbyclor  40-12.5 MG Tabs Generic drug: Azilsartan-Chlorthalidone  Take 1 tablet by mouth daily as needed (for high blood pressure).   escitalopram  5 MG tablet Commonly known as: LEXAPRO  TAKE 1 TABLET(5 MG) BY MOUTH DAILY   omega-3 acid ethyl esters 1 g capsule Commonly known as: LOVAZA  Take 2 capsules (2 g total) by mouth 2 (two) times daily. NEEDS FOLLOW UP VISIT  FOR REFILLS   rosuvastatin  20 MG tablet Commonly known as: CRESTOR  TAKE 1 TABLET(20 MG) BY MOUTH DAILY. Patient to follow up with PCP prior to future refills   traZODone  50 MG tablet Commonly known as: DESYREL  TAKE 1 TABLET(50 MG) BY MOUTH AT BEDTIME   Zepbound  15 MG/0.5ML Soln Generic drug: Tirzepatide -Weight Management Inject 15 mg into the skin once a week.         Discharge Exam: There were no vitals filed for this visit.  Physical Exam Vitals and nursing note reviewed.  Constitutional:      General: He is not in acute distress. Cardiovascular:     Rate and Rhythm: Normal rate.  Pulmonary:     Effort: No respiratory distress.     Breath sounds: No wheezing or rales.  Abdominal:     General: There is no distension.  Musculoskeletal:     Right lower leg: No edema.     Left lower leg: No edema.      Condition at discharge: stable  The results of significant diagnostics from this hospitalization (including imaging, microbiology, ancillary and laboratory) are listed below for reference.   Imaging Studies: DG Toe Great Right Result Date: 10/29/2024 EXAM: VIEW(S) XRAY OF THE TOES 10/29/2024 12:22:00 PM COMPARISON: None available. CLINICAL HISTORY: Pain FINDINGS: BONES AND JOINTS: Acute nondisplaced fracture of great toe proximal phalanx. Intra-articular extension. No malalignment. SOFT TISSUES: Mild soft tissue swelling. IMPRESSION: 1. Acute nondisplaced intraarticular fracture of the great toe proximal phalanx. Electronically signed by: Selinda Blue MD 10/29/2024 01:10 PM EST RP Workstation: [id number]   CT ABDOMEN PELVIS W CONTRAST Result Date: 10/28/2024 CLINICAL DATA:  Provided history: chest pain, elevated lipase, heavy alcohol use history EXAM: CT ABDOMEN AND PELVIS WITH CONTRAST TECHNIQUE: Multidetector CT imaging of the abdomen and pelvis was performed using the standard protocol following bolus administration of intravenous contrast. RADIATION DOSE REDUCTION: This exam was performed according to the departmental dose-optimization program which includes automated exposure control, adjustment of the mA and/or kV according to patient size and/or use of iterative reconstruction technique. CONTRAST:  75mL OMNIPAQUE  IOHEXOL  350 MG/ML SOLN COMPARISON:  CT 01/21/2022.  MRI 03/01/2022 FINDINGS: Lower chest: Assessed on concurrent chest CT, reported  separately. Hepatobiliary: Diffuse hepatic steatosis. No focal liver lesion. Gallbladder physiologically distended, no calcified stone. No biliary dilatation. Pancreas: No fat stranding or inflammatory change about the pancreas. No ductal dilatation or pancreatic mass. Spleen: Normal in size without focal abnormality. Adrenals/Urinary Tract: Stable a 2.8 cm right adrenal nodule, previously characterized as adenoma. Normal left adrenal gland. No hydronephrosis or renal calculi. Mild symmetric bilateral perinephric stranding. No suspicious renal lesion. Unremarkable urinary bladder. Stomach/Bowel: Decompressed stomach. No small bowel obstruction or inflammation. Normal appendix. Wall thickening versus nondistention of the ascending colon. Small volume of formed colonic stool. Vascular/Lymphatic: Mild aorto bi-iliac atherosclerosis. No aortic aneurysm. The portal vein is patent. Splenic and mesenteric veins are patent. No enlarged lymph nodes in the abdomen or pelvis. Reproductive: Prostate is unremarkable. Other: No free air, free fluid, or intra-abdominal fluid collection. Small fat containing umbilical hernia. Musculoskeletal: L5-S1 degenerative disc disease with multilevel lower lumbar facet hypertrophy. CT CT bone IMPRESSION: 1. No CT findings of acute pancreatitis. 2. Wall thickening versus nondistention of the ascending colon, can be seen with colitis. 3. Hepatic steatosis. 4. Stable right adrenal adenoma. Aortic Atherosclerosis (ICD10-I70.0). Electronically Signed   By: Andrea Gasman M.D.   On: 10/28/2024 13:27   CT Angio Chest PE W and/or Wo Contrast Result Date: 10/28/2024  CLINICAL DATA:  Pulmonary embolism (PE) suspected, high prob Chest pain. EXAM: CT ANGIOGRAPHY CHEST WITH CONTRAST TECHNIQUE: Multidetector CT imaging of the chest was performed using the standard protocol during bolus administration of intravenous contrast. Multiplanar CT image reconstructions and MIPs were obtained to evaluate the  vascular anatomy. Performed in conjunction with CT of the abdomen and pelvis. RADIATION DOSE REDUCTION: This exam was performed according to the departmental dose-optimization program which includes automated exposure control, adjustment of the mA and/or kV according to patient size and/or use of iterative reconstruction technique. CONTRAST:  75mL OMNIPAQUE  IOHEXOL  350 MG/ML SOLN COMPARISON:  Radiograph earlier today FINDINGS: Cardiovascular: There are no filling defects within the pulmonary arteries to suggest pulmonary embolus. The main pulmonary artery is dilated at 4 cm. The heart is borderline enlarged. No acute aortic findings. Common origin of the brachiocephalic and left common carotid artery, variant arch anatomy. No pericardial effusion. Mediastinum/Nodes: No mediastinal or hilar adenopathy. Unremarkable appearance of the esophagus. 7 mm distal paraesophageal lymph node, series 6, image 113. No visible thyroid  nodule. Lungs/Pleura: Mild diffuse tree-in-bud opacities throughout both lungs. No confluent consolidation. No features of pulmonary edema. No pleural fluid. Upper Abdomen: Assessed on concurrent abdominopelvic CT, reported separately. Musculoskeletal: There are no acute or suspicious osseous abnormalities. Remote healed left rib fractures. Review of the MIP images confirms the above findings. IMPRESSION: 1. No pulmonary embolus. 2. Mild diffuse tree-in-bud opacities throughout both lungs, typical of bronchiolitis or atypical infection. 3. Dilated main pulmonary artery at 4 cm, can be seen with pulmonary arterial hypertension. Electronically Signed   By: Andrea Gasman M.D.   On: 10/28/2024 13:23   DG Chest Portable 1 View Result Date: 10/28/2024 EXAM: 1 VIEW(S) XRAY OF THE CHEST 10/28/2024 11:22:00 AM COMPARISON: None available. CLINICAL HISTORY: FINDINGS: LUNGS AND PLEURA: Low normal lung volumes. No focal pulmonary opacity. No pleural effusion. No pneumothorax. HEART AND MEDIASTINUM: No  acute abnormality of the cardiac and mediastinal silhouettes. BONES AND SOFT TISSUES: Chronic left rib fractures. IMPRESSION: 1. No acute cardiopulmonary abnormality. Electronically signed by: Waddell Calk MD 10/28/2024 11:39 AM EST RP Workstation: [id number]    Microbiology: Results for orders placed or performed during the hospital encounter of 10/28/24  Blood culture (routine x 2)     Status: None (Preliminary result)   Collection Time: 10/28/24  1:25 PM   Specimen: BLOOD RIGHT ARM  Result Value Ref Range Status   Specimen Description BLOOD RIGHT ARM  Final   Special Requests   Final    BOTTLES DRAWN AEROBIC AND ANAEROBIC Blood Culture results may not be optimal due to an inadequate volume of blood received in culture bottles   Culture   Final    NO GROWTH 2 DAYS Performed at [facility], [address] [place], [place] [address]    Report Status PENDING  Incomplete  Resp panel by RT-PCR (RSV, Flu A&B, Covid) Anterior Nasal Swab     Status: None   Collection Time: 10/28/24  3:06 PM   Specimen: Anterior Nasal Swab  Result Value Ref Range Status   SARS Coronavirus 2 by RT PCR NEGATIVE NEGATIVE Final   Influenza A by PCR NEGATIVE NEGATIVE Final   Influenza B by PCR NEGATIVE NEGATIVE Final    Comment: (NOTE) The Xpert Xpress SARS-CoV-2/FLU/RSV plus assay is intended as an aid in the diagnosis of influenza from Nasopharyngeal swab specimens and should not be used as a sole basis for treatment. Nasal washings and aspirates are unacceptable for Xpert Xpress  SARS-CoV-2/FLU/RSV testing.  Fact Sheet for Patients: bloggercourse.com  Fact Sheet for Healthcare Providers: seriousbroker.it  This test is not yet approved or cleared by the [place]  FDA and has been authorized for detection and/or diagnosis of SARS-CoV-2 by FDA under an Emergency Use Authorization (EUA). This EUA will remain in effect (meaning this test can be  used) for the duration of the COVID-19 declaration under Section 564(b)(1) of the Act, 21 U.S.C. section 360bbb-3(b)(1), unless the authorization is terminated or revoked.     Resp Syncytial Virus by PCR NEGATIVE NEGATIVE Final    Comment: (NOTE) Fact Sheet for Patients: bloggercourse.com  Fact Sheet for Healthcare Providers: seriousbroker.it  This test is not yet approved or cleared by the [place]  FDA and has been authorized for detection and/or diagnosis of SARS-CoV-2 by FDA under an Emergency Use Authorization (EUA). This EUA will remain in effect (meaning this test can be used) for the duration of the COVID-19 declaration under Section 564(b)(1) of the Act, 21 U.S.C. section 360bbb-3(b)(1), unless the authorization is terminated or revoked.  Performed at [facility], [address], [place], [place] [address]     Labs: CBC: Recent Labs  Lab 10/28/24 1051 10/29/24 0126  WBC 14.7* 13.4*  HGB 16.8 15.0  HCT 48.7 43.0  MCV 92.4 91.3  PLT 319 235   Basic Metabolic Panel: Recent Labs  Lab 10/28/24 1051 10/29/24 0126  NA 137 139  K 3.3* 3.7  CL 108 106  CO2 15* 22  GLUCOSE 153* 87  BUN 19 16  CREATININE 0.91 1.01  CALCIUM  7.3* 8.6*   Liver Function Tests: Recent Labs  Lab 10/28/24 1051 10/29/24 0126  AST 26 24  ALT 45* 42  ALKPHOS 52 54  BILITOT 0.2 0.4  PROT 5.9* 6.1*  ALBUMIN 3.9 4.0   CBG: Recent Labs  Lab 10/29/24 1252 10/29/24 1655 10/29/24 2113 10/30/24 0608 10/30/24 1107  GLUCAP 108* 111* 107* 101* 86    Discharge time spent: 35 minutes  Signed: Casimer Dare, MD [facility] 10/30/2024 "

## 2024-11-02 ENCOUNTER — Telehealth: Payer: Self-pay

## 2024-11-02 LAB — CULTURE, BLOOD (ROUTINE X 2): Culture: NO GROWTH

## 2024-11-02 NOTE — Transitions of Care (Post Inpatient/ED Visit) (Signed)
" ° °  11/02/2024  Name: Anthony Nielsen MRN: [id number] DOB: 04/02/1976  Today's TOC FU Call Status: Today's TOC FU Call Status:: Unsuccessful Call (1st Attempt) Unsuccessful Call (1st Attempt) Date: 11/02/24  Attempted to reach the patient regarding the most recent Inpatient/ED visit.  Follow Up Plan: Additional outreach attempts will be made to reach the patient to complete the Transitions of Care (Post Inpatient/ED visit) call.   Arvin Seip RN, BSN, CCM [employer], Population Health Case Manager Phone: [phone]  "

## 2024-11-05 ENCOUNTER — Ambulatory Visit: Payer: Self-pay | Admitting: Family Medicine

## 2024-11-05 ENCOUNTER — Encounter: Payer: Self-pay | Admitting: Family Medicine

## 2024-11-05 ENCOUNTER — Telehealth: Payer: Self-pay | Admitting: *Deleted

## 2024-11-05 VITALS — BP 130/82 | HR 94 | Temp 98.3°F | Resp 19 | Ht 71.0 in | Wt 240.0 lb

## 2024-11-05 DIAGNOSIS — A419 Sepsis, unspecified organism: Secondary | ICD-10-CM

## 2024-11-05 DIAGNOSIS — E119 Type 2 diabetes mellitus without complications: Secondary | ICD-10-CM

## 2024-11-05 DIAGNOSIS — R748 Abnormal levels of other serum enzymes: Secondary | ICD-10-CM

## 2024-11-05 DIAGNOSIS — E781 Pure hyperglyceridemia: Secondary | ICD-10-CM

## 2024-11-05 DIAGNOSIS — Z7985 Long-term (current) use of injectable non-insulin antidiabetic drugs: Secondary | ICD-10-CM | POA: Insufficient documentation

## 2024-11-05 DIAGNOSIS — F410 Panic disorder [episodic paroxysmal anxiety] without agoraphobia: Secondary | ICD-10-CM

## 2024-11-05 DIAGNOSIS — K760 Fatty (change of) liver, not elsewhere classified: Secondary | ICD-10-CM

## 2024-11-05 DIAGNOSIS — F5104 Psychophysiologic insomnia: Secondary | ICD-10-CM

## 2024-11-05 DIAGNOSIS — S92404D Nondisplaced unspecified fracture of right great toe, subsequent encounter for fracture with routine healing: Secondary | ICD-10-CM

## 2024-11-05 LAB — COMPREHENSIVE METABOLIC PANEL WITH GFR
ALT: 49 U/L (ref 3–53)
AST: 24 U/L (ref 5–37)
Albumin: 4.8 g/dL (ref 3.5–5.2)
Alkaline Phosphatase: 56 U/L (ref 39–117)
BUN: 29 mg/dL — ABNORMAL HIGH (ref 6–23)
CO2: 26 meq/L (ref 19–32)
Calcium: 9.8 mg/dL (ref 8.4–10.5)
Chloride: 102 meq/L (ref 96–112)
Creatinine, Ser: 1.19 mg/dL (ref 0.40–1.50)
GFR: 72.91 mL/min
Glucose, Bld: 97 mg/dL (ref 70–99)
Potassium: 4.5 meq/L (ref 3.5–5.1)
Sodium: 136 meq/L (ref 135–145)
Total Bilirubin: 0.7 mg/dL (ref 0.2–1.2)
Total Protein: 7.7 g/dL (ref 6.0–8.3)

## 2024-11-05 LAB — CBC WITH DIFFERENTIAL/PLATELET
Basophils Absolute: 0.1 K/uL (ref 0.0–0.1)
Basophils Relative: 0.5 % (ref 0.0–3.0)
Eosinophils Absolute: 0.1 K/uL (ref 0.0–0.7)
Eosinophils Relative: 1.1 % (ref 0.0–5.0)
HCT: 49.5 % (ref 39.0–52.0)
Hemoglobin: 16.6 g/dL (ref 13.0–17.0)
Lymphocytes Relative: 28.7 % (ref 12.0–46.0)
Lymphs Abs: 3.2 K/uL (ref 0.7–4.0)
MCHC: 33.5 g/dL (ref 30.0–36.0)
MCV: 93.1 fl (ref 78.0–100.0)
Monocytes Absolute: 0.8 K/uL (ref 0.1–1.0)
Monocytes Relative: 7.1 % (ref 3.0–12.0)
Neutro Abs: 7 K/uL (ref 1.4–7.7)
Neutrophils Relative %: 62.6 % (ref 43.0–77.0)
Platelets: 263 K/uL (ref 150.0–400.0)
RBC: 5.32 Mil/uL (ref 4.22–5.81)
RDW: 14.2 % (ref 11.5–15.5)
WBC: 11.1 K/uL — ABNORMAL HIGH (ref 4.0–10.5)

## 2024-11-05 LAB — LIPID PANEL
Cholesterol: 236 mg/dL — ABNORMAL HIGH (ref 28–200)
HDL: 37.1 mg/dL — ABNORMAL LOW
LDL Cholesterol: 133 mg/dL — ABNORMAL HIGH (ref 10–99)
NonHDL: 198.72
Total CHOL/HDL Ratio: 6
Triglycerides: 327 mg/dL — ABNORMAL HIGH (ref 10.0–149.0)
VLDL: 65.4 mg/dL — ABNORMAL HIGH (ref 0.0–40.0)

## 2024-11-05 LAB — LIPASE: Lipase: 41 U/L (ref 11.0–59.0)

## 2024-11-05 NOTE — Patient Instructions (Addendum)
 We are checking labs today, will be in contact with any results that require further attention  Continue current medication regimen.   Follow-up with me for new or worsening symptoms.  Follow up with PCP as scheduled, sooner if needed

## 2024-11-05 NOTE — Transitions of Care (Post Inpatient/ED Visit) (Signed)
" ° °  11/05/2024  Name: Anthony Nielsen MRN: [id number] DOB: 08-18-1976  Today's TOC FU Call Status: Today's TOC FU Call Status:: Unsuccessful Call (2nd Attempt) Unsuccessful Call (2nd Attempt) Date: 11/05/24  Attempted to reach the patient regarding the most recent Inpatient/ED visit.  Follow Up Plan: Additional outreach attempts will be made to reach the patient to complete the Transitions of Care (Post Inpatient/ED visit) call.   Cathlean Headland BSN RN [facility] Health Care Management Coordinator [email or web address] Direct Dial: [phone]  Fax: [phone] Website: [email or web address]  "

## 2024-11-05 NOTE — Progress Notes (Signed)
 "  Acute Office Visit  Subjective:     Patient ID: Anthony Nielsen, male    DOB: September 17, 1976, 48 y.o.   MRN: [id number]  Chief Complaint  Patient presents with   Hospitalization Follow-up    Hospital follow up having trouble sleeping      HPI  Discussed the use of AI scribe software for clinical note transcription with the patient, who gave verbal consent to proceed.  History of Present Illness Anthony Nielsen is a 48 year old male who presents with chest pain and a broken toe.  HFU admission 10/28/24-10/30/24 fpr pneumonia with sepsis TOC call 11/02/24  Acute chest pain and associated symptoms - Sudden onset chest pain with nausea between 8 and 10 AM - Pain described as unlike any prior pain - No cough during the illness - Hospitalized and treated; chest pain and nausea resolved within 1 to 2 days - Diagnosed with atypical or viral pneumonia during hospitalization  Toe injury - Believes he broke a toe while rushing out of the house to meet the ambulance - Uncertain of the exact injury - Upcoming foot specialist visit scheduled - Using pain medication with some improvement in bruising  Sleep disturbance and anxiety - Difficulty sleeping attributed to anxiety and toe pain - Xanax  provided slight improvement during hospitalization - Previously tried trazodone  and Cymbalta  but discontinued both due to side effects - Not currently taking Lexapro  or trazodone   Medication changes and blood pressure management - Stopped Crestor  over the summer due to concern about kidney effects - Currently taking Zepbound  and a blood pressure medication - Zepbound  perceived to have improved blood pressure  Recent infectious exposures - Mother hospitalized with pneumonia two weeks prior to his illness - Son experienced nausea around the same time as his illness, raising concern for a shared viral illness     ROS Per HPI      Objective:    BP 130/82 (BP Location: Right  Arm, Patient Position: Sitting, Cuff Size: Normal)   Pulse 94   Temp 98.3 F (36.8 C) (Temporal)   Resp 19   Ht 5' 11 (1.803 m)   Wt 240 lb (108.9 kg)   SpO2 96%   BMI 33.47 kg/m    Physical Exam Vitals and nursing note reviewed.  Constitutional:      General: He is not in acute distress.    Appearance: Normal appearance.  HENT:     Head: Normocephalic and atraumatic.     Right Ear: External ear normal.     Left Ear: External ear normal.     Nose: Nose normal.     Mouth/Throat:     Mouth: Mucous membranes are moist.     Pharynx: Oropharynx is clear.  Eyes:     Extraocular Movements: Extraocular movements intact.  Cardiovascular:     Rate and Rhythm: Normal rate and regular rhythm.     Pulses: Normal pulses.     Heart sounds: Normal heart sounds.  Pulmonary:     Effort: Pulmonary effort is normal. No respiratory distress.     Breath sounds: Normal breath sounds. No wheezing, rhonchi or rales.  Musculoskeletal:     Cervical back: Normal range of motion.     Right lower leg: No edema.     Left lower leg: No edema.     Comments: Swelling, bruising, tenderness to R great toe. LROM  Lymphadenopathy:     Cervical: No cervical adenopathy.  Skin:    General: Skin  is warm and dry.  Neurological:     General: No focal deficit present.     Mental Status: He is alert and oriented to person, place, and time.  Psychiatric:        Mood and Affect: Mood normal.        Behavior: Behavior normal.     No results found for any visits on 11/05/24.      Assessment & Plan:   Assessment and Plan Assessment & Plan Sepsis due to pneumonia Recent hospitalization for atypical pneumonia with sepsis. Blood cultures negative. - Checked white blood cell count. - Ordered chest CT to compare with previous imaging. [facility] notes, labs and imaging reports reviewed by me  Closed nondisplaced fracture of phalanx of right great toe with routine healing Fracture with significant pain.   - Xray reviewed by me - Attend foot specialist appointment for further evaluation and management. - Consider post-op shoe or boot for weight offloading.  Nonalcoholic fatty liver disease Monitoring liver function as part of overall health assessment.  Panic disorder Experiencing anxiety and sleep disturbances. Previous medications not well tolerated. Xanax  provided relief during hospitalization.  Type 2 diabetes mellitus with hyperglycemia, without long term use of insulin , long term use non insulin  injectables, elevated lipase Managed with Zepbound . Monitoring for potential side effects, including elevated lipase levels indicating pancreatic issues. - Checked lipase levels. - Will consider reducing Zepbound  dose if lipase levels are elevated.  Hypertriglyceridemia Previously managed with Crestor , discontinued due to kidney concerns and family history. Monitoring cholesterol levels to determine need for resuming medication. - Checked cholesterol levels. - Will evaluate need for resuming Crestor  based on lab results.      Orders Placed This Encounter  Procedures   CBC with Differential/Platelet    Release to patient:   Immediate [1]   Comprehensive metabolic panel with GFR    Release to patient:   Immediate [1]   Lipid panel   Hemoglobin A1c   Lipase     No orders of the defined types were placed in this encounter.   Return for PCP as scheduled, sooner if needed.  Corean LITTIE Ku, FNP  "

## 2024-11-06 ENCOUNTER — Telehealth: Payer: Self-pay | Admitting: *Deleted

## 2024-11-06 LAB — HEMOGLOBIN A1C: Hgb A1c MFr Bld: 5.8 % (ref 4.6–6.5)

## 2024-11-06 MED ORDER — ALPRAZOLAM 0.5 MG PO TABS: 0.5000 mg | ORAL_TABLET | Freq: Every evening | ORAL | 0 refills | Status: DC | PRN

## 2024-11-06 NOTE — Transitions of Care (Post Inpatient/ED Visit) (Signed)
" ° °  11/06/2024  Name: Anthony Nielsen MRN: [id number] DOB: [birth year]  Today's TOC FU Call Status: Today's TOC FU Call Status:: Unsuccessful Call (3rd Attempt) Unsuccessful Call (3rd Attempt) Date: 11/06/24  Attempted to reach the patient regarding the most recent Inpatient/ED visit.  Follow Up Plan: No further outreach attempts will be made at this time. We have been unable to contact the patient.  Cathlean Headland BSN RN  [facility] Health Care Management Coordinator [email or web address] Direct Dial: [phone]  Fax: [phone] Website: [email or web address]  "

## 2024-11-07 ENCOUNTER — Ambulatory Visit: Admitting: Podiatry

## 2024-11-07 ENCOUNTER — Encounter: Payer: Self-pay | Admitting: Podiatry

## 2024-11-07 DIAGNOSIS — S92414A Nondisplaced fracture of proximal phalanx of right great toe, initial encounter for closed fracture: Secondary | ICD-10-CM | POA: Diagnosis not present

## 2024-11-07 NOTE — Progress Notes (Signed)
"  °  Subjective:  Patient ID: Anthony Nielsen, male    DOB: 1976-07-14,   MRN: [id number]  Chief Complaint  Patient presents with   Foot Pain    F/U on R hallux toe pain patient relates it has improved since LMV    48 y.o. male presents for concern of a right great toe fracture.  He relates last week he had some shortness of breath and was seen in the hospital that same night he does not recall exactly but believes he bumped his toe.  While in the hospital they took x-rays and found to have a fracture of the toe.  He relates still some pain in the toe but has been improving.  He has been in good supportive shoes.. Denies any other pedal complaints. Denies n/v/f/c.   Past Medical History:  Diagnosis Date   Hyperlipidemia    Hypertension    Smoker     Objective:  Physical Exam: Vascular: DP/PT pulses 2/4 bilateral. CFT <3 seconds. Normal hair growth on digits. No edema.  Skin. No lacerations or abrasions bilateral feet.  Right hallux tender to palpation distal proximal phalanx. Musculoskeletal: MMT 5/5 bilateral lower extremities in DF, PF, Inversion and Eversion. Deceased ROM in DF of ankle joint.  Neurological: Sensation intact to light touch.   Assessment:   1. Closed nondisplaced fracture of proximal phalanx of right great toe, initial encounter      Plan:  Patient was evaluated and treated and all questions answered. -Xrays reviewed acute nondisplaced fracture noted in the distal proximal phalanx of the hallux that tracks into the articular surface. -Discussed treatement options for toe fracture; risks, alternatives, and benefits explained. -Will continue good supportive shoe for now.   -Recommend protection, rest, ice, elevation daily until symptoms improve -Rx pain med/anti-inflammatories as needed -Patient to return to office in 4 weeks for serial x-rays to assess healing  or sooner if condition worsens.   Asberry Failing, DPM    "

## 2024-11-09 ENCOUNTER — Ambulatory Visit: Payer: Self-pay | Admitting: Family Medicine

## 2024-11-09 DIAGNOSIS — E782 Mixed hyperlipidemia: Secondary | ICD-10-CM

## 2024-11-09 MED ORDER — ROSUVASTATIN CALCIUM 40 MG PO TABS: 40.0000 mg | ORAL_TABLET | Freq: Every day | ORAL | 1 refills | Status: AC

## 2024-11-19 ENCOUNTER — Other Ambulatory Visit: Payer: Self-pay | Admitting: Family Medicine

## 2024-11-19 DIAGNOSIS — F5104 Psychophysiologic insomnia: Secondary | ICD-10-CM

## 2024-11-21 ENCOUNTER — Ambulatory Visit: Admitting: Podiatry

## 2024-11-27 ENCOUNTER — Other Ambulatory Visit: Payer: Self-pay | Admitting: Medical Genetics

## 2024-11-27 DIAGNOSIS — Z006 Encounter for examination for normal comparison and control in clinical research program: Secondary | ICD-10-CM

## 2024-12-05 ENCOUNTER — Other Ambulatory Visit: Payer: Self-pay | Admitting: Internal Medicine

## 2024-12-06 ENCOUNTER — Ambulatory Visit

## 2024-12-06 ENCOUNTER — Encounter: Payer: Self-pay | Admitting: Family Medicine

## 2024-12-06 ENCOUNTER — Ambulatory Visit: Admitting: Family Medicine

## 2024-12-06 VITALS — BP 100/72 | HR 98 | Temp 97.6°F | Ht 71.0 in | Wt 235.4 lb

## 2024-12-06 DIAGNOSIS — I10 Essential (primary) hypertension: Secondary | ICD-10-CM

## 2024-12-06 DIAGNOSIS — D72828 Other elevated white blood cell count: Secondary | ICD-10-CM | POA: Diagnosis not present

## 2024-12-06 DIAGNOSIS — R799 Abnormal finding of blood chemistry, unspecified: Secondary | ICD-10-CM

## 2024-12-06 DIAGNOSIS — E782 Mixed hyperlipidemia: Secondary | ICD-10-CM | POA: Diagnosis not present

## 2024-12-06 DIAGNOSIS — Z8701 Personal history of pneumonia (recurrent): Secondary | ICD-10-CM

## 2024-12-06 DIAGNOSIS — F410 Panic disorder [episodic paroxysmal anxiety] without agoraphobia: Secondary | ICD-10-CM

## 2024-12-06 DIAGNOSIS — Z8619 Personal history of other infectious and parasitic diseases: Secondary | ICD-10-CM

## 2024-12-06 DIAGNOSIS — F411 Generalized anxiety disorder: Secondary | ICD-10-CM | POA: Diagnosis not present

## 2024-12-06 LAB — CBC WITH DIFFERENTIAL/PLATELET
Basophils Absolute: 0 10*3/uL (ref 0.0–0.1)
Basophils Relative: 0.4 % (ref 0.0–3.0)
Eosinophils Absolute: 0.2 10*3/uL (ref 0.0–0.7)
Eosinophils Relative: 1.7 % (ref 0.0–5.0)
HCT: 46.5 % (ref 39.0–52.0)
Hemoglobin: 15.8 g/dL (ref 13.0–17.0)
Lymphocytes Relative: 35.8 % (ref 12.0–46.0)
Lymphs Abs: 3.3 10*3/uL (ref 0.7–4.0)
MCHC: 34 g/dL (ref 30.0–36.0)
MCV: 94 fl (ref 78.0–100.0)
Monocytes Absolute: 0.7 10*3/uL (ref 0.1–1.0)
Monocytes Relative: 7.6 % (ref 3.0–12.0)
Neutro Abs: 5 10*3/uL (ref 1.4–7.7)
Neutrophils Relative %: 54.5 % (ref 43.0–77.0)
Platelets: 248 10*3/uL (ref 150.0–400.0)
RBC: 4.94 Mil/uL (ref 4.22–5.81)
RDW: 14.1 % (ref 11.5–15.5)
WBC: 9.2 10*3/uL (ref 4.0–10.5)

## 2024-12-06 LAB — COMPREHENSIVE METABOLIC PANEL WITH GFR
ALT: 47 U/L (ref 3–53)
AST: 32 U/L (ref 5–37)
Albumin: 5 g/dL (ref 3.5–5.2)
Alkaline Phosphatase: 53 U/L (ref 39–117)
BUN: 29 mg/dL — ABNORMAL HIGH (ref 6–23)
CO2: 29 meq/L (ref 19–32)
Calcium: 10.1 mg/dL (ref 8.4–10.5)
Chloride: 102 meq/L (ref 96–112)
Creatinine, Ser: 1.39 mg/dL (ref 0.40–1.50)
GFR: 60.47 mL/min
Glucose, Bld: 93 mg/dL (ref 70–99)
Potassium: 4.6 meq/L (ref 3.5–5.1)
Sodium: 138 meq/L (ref 135–145)
Total Bilirubin: 0.6 mg/dL (ref 0.2–1.2)
Total Protein: 7.6 g/dL (ref 6.0–8.3)

## 2024-12-06 MED ORDER — ALPRAZOLAM 0.5 MG PO TABS: 0.5000 mg | ORAL_TABLET | Freq: Three times a day (TID) | ORAL | 0 refills | Status: AC | PRN

## 2024-12-06 NOTE — Patient Instructions (Signed)
 We are getting an xray today. We will be in contact with any abnormal results that require further attention.  Monitor blood pressures at home over the next month to help determine the need for medications.   Continue current medication regimen.   Follow up with specialists as scheduled.   We are checking labs today, will be in contact with any results that require further attention  Follow-up with PCP as scheduled

## 2024-12-06 NOTE — Progress Notes (Signed)
 "  Acute Office Visit  Subjective:     Patient ID: Anthony Nielsen, male    DOB: 1976/04/30, 49 y.o.   MRN: [id number]  Chief Complaint  Patient presents with   Follow-up    HPI  Discussed the use of AI scribe software for clinical note transcription with the patient, who gave verbal consent to proceed.  History of Present Illness Anthony Nielsen is a 49 year old male who presents for medication management and follow-up.  GAD, panic disorder - Using one and a half Xanax  at night and one during the day for anxiety symptoms - Requesting increased dosing temporarily  Hypertension management - Ran out of blood pressure medication last night and has not taken it today - Previously discontinued blood pressure medication over the summer - Perceives blood pressure has been somewhat low recently  Hyperlipidemia management - Continues Crestor  for elevated cholesterol  Weight loss and blood pressure - Experienced prior weight loss, which he feels improved blood pressure control  Recent diagnostic testing - Recalls recent x-ray and blood work including white blood cell count and kidney function tests - Unaware of the results of these tests     ROS Per HPI      Objective:    BP 100/72 (BP Location: Left Arm, Patient Position: Sitting)   Pulse 98   Temp 97.6 F (36.4 C) (Temporal)   Ht 5' 11 (1.803 m)   Wt 235 lb 6.4 oz (106.8 kg)   SpO2 96%   BMI 32.83 kg/m    Physical Exam Vitals and nursing note reviewed.  Constitutional:      General: He is not in acute distress.    Appearance: Normal appearance.  HENT:     Head: Normocephalic and atraumatic.     Right Ear: External ear normal.     Left Ear: External ear normal.     Nose: Nose normal.     Mouth/Throat:     Mouth: Mucous membranes are moist.     Pharynx: Oropharynx is clear.  Eyes:     Extraocular Movements: Extraocular movements intact.  Cardiovascular:     Rate and Rhythm: Normal rate and  regular rhythm.     Pulses: Normal pulses.     Heart sounds: Normal heart sounds.  Pulmonary:     Effort: Pulmonary effort is normal. No respiratory distress.     Breath sounds: Normal breath sounds. No wheezing, rhonchi or rales.  Musculoskeletal:        General: Normal range of motion.     Cervical back: Normal range of motion.     Right lower leg: No edema.     Left lower leg: No edema.  Lymphadenopathy:     Cervical: No cervical adenopathy.  Skin:    General: Skin is warm and dry.  Neurological:     General: No focal deficit present.     Mental Status: He is alert and oriented to person, place, and time.  Psychiatric:        Mood and Affect: Mood normal.        Behavior: Behavior normal.     Results for orders placed or performed in visit on 12/06/24  CBC with Differential/Platelet  Result Value Ref Range   WBC 9.2 4.0 - 10.5 K/uL   RBC 4.94 4.22 - 5.81 Mil/uL   Hemoglobin 15.8 13.0 - 17.0 g/dL   HCT 53.4 60.9 - 47.9 %   MCV 94.0 78.0 - 100.0 fl  MCHC 34.0 30.0 - 36.0 g/dL   RDW 85.8 88.4 - 84.4 %   Platelets 248.0 150.0 - 400.0 K/uL   Neutrophils Relative % 54.5 43.0 - 77.0 %   Lymphocytes Relative 35.8 12.0 - 46.0 %   Monocytes Relative 7.6 3.0 - 12.0 %   Eosinophils Relative 1.7 0.0 - 5.0 %   Basophils Relative 0.4 0.0 - 3.0 %   Neutro Abs 5.0 1.4 - 7.7 K/uL   Lymphs Abs 3.3 0.7 - 4.0 K/uL   Monocytes Absolute 0.7 0.1 - 1.0 K/uL   Eosinophils Absolute 0.2 0.0 - 0.7 K/uL   Basophils Absolute 0.0 0.0 - 0.1 K/uL  Comprehensive metabolic panel with GFR  Result Value Ref Range   Sodium 138 135 - 145 mEq/L   Potassium 4.6 3.5 - 5.1 mEq/L   Chloride 102 96 - 112 mEq/L   CO2 29 19 - 32 mEq/L   Glucose, Bld 93 70 - 99 mg/dL   BUN 29 (H) 6 - 23 mg/dL   Creatinine, Ser 8.60 0.40 - 1.50 mg/dL   Total Bilirubin 0.6 0.2 - 1.2 mg/dL   Alkaline Phosphatase 53 39 - 117 U/L   AST 32 5 - 37 U/L   ALT 47 3 - 53 U/L   Total Protein 7.6 6.0 - 8.3 g/dL   Albumin 5.0 3.5 -  5.2 g/dL   GFR 39.52 >39.99 mL/min   Calcium  10.1 8.4 - 10.5 mg/dL        Assessment & Plan:   Assessment and Plan Assessment & Plan Panic disorder, GAD Managed with Xanax . Adjusted Xanax  to three times daily as needed, anticipating reduced need. - Ensured Xanax  prescription was filled with adjusted dosing. - Likely related to recent hospitalization, if not improving over the next month, will taper down and consider long-term antianxiety medication  Leukocystosis, elevated BUN, hx pneumonia, hx sepsis Requested evaluation of WBC and BUN levels. Previous x-ray satisfactory, repeat planned for thoroughness given recent history of pneumonia with sepsis - Ordered repeat x-ray to ensure no changes. - Ordered laboratory tests for white blood cell count and BUN.  Mixed Hyperlipidemia Managed with Crestor . Reports elevated cholesterol, acknowledges need to continue medication. - Continue Crestor  for hyperlipidemia management.  Essential Hypertension - chronic, improved - no need to refill edarbyclor  with BP of 100/72 - monitor BP at  home over the next month to determine need for antihypertensives     Orders Placed This Encounter  Procedures   DG Chest 2 View    Standing Status:   Future    Number of Occurrences:   1    Expiration Date:   06/05/2025    Reason for Exam (SYMPTOM  OR DIAGNOSIS REQUIRED):   hx pneumonia, sepsis    Preferred imaging location?:   [facility]   CBC with Differential/Platelet    Release to patient:   Immediate [1]   Comprehensive metabolic panel with GFR    Release to patient:   Immediate [1]     Meds ordered this encounter  Medications   ALPRAZolam  (XANAX ) 0.5 MG tablet    Sig: Take 1 tablet (0.5 mg total) by mouth 3 (three) times daily as needed for anxiety.    Dispense:  90 tablet    Refill:  0    Return for PCP as scheduled.  Corean LITTIE Ku, FNP  "

## 2024-12-07 ENCOUNTER — Ambulatory Visit: Payer: Self-pay | Admitting: Family Medicine
